# Patient Record
Sex: Female | Born: 1970
Health system: Southern US, Community
[De-identification: ages and names within clinical notes are randomized; demographics above are authoritative.]

## PROBLEM LIST (undated history)

## (undated) DIAGNOSIS — I639 Cerebral infarction, unspecified: Secondary | ICD-10-CM

## (undated) DIAGNOSIS — B019 Varicella without complication: Secondary | ICD-10-CM

## (undated) DIAGNOSIS — N920 Excessive and frequent menstruation with regular cycle: Secondary | ICD-10-CM

## (undated) DIAGNOSIS — I10 Essential (primary) hypertension: Secondary | ICD-10-CM

## (undated) HISTORY — PX: ABDOMINAL HYSTERECTOMY: SHX81

## (undated) HISTORY — DX: Cerebral infarction, unspecified: I63.9

## (undated) HISTORY — DX: Excessive and frequent menstruation with regular cycle: N92.0

## (undated) HISTORY — PX: OTHER SURGICAL HISTORY: SHX169

## (undated) HISTORY — DX: Essential (primary) hypertension: I10

## (undated) HISTORY — PX: TUBAL LIGATION: SHX77

## (undated) HISTORY — DX: Varicella without complication: B01.9

---

## 2014-04-11 ENCOUNTER — Ambulatory Visit: Payer: Self-pay | Admitting: Physician Assistant

## 2014-04-11 LAB — URINALYSIS, COMPLETE
BILIRUBIN, UR: NEGATIVE
Blood: NEGATIVE
GLUCOSE, UR: NEGATIVE
Ketone: NEGATIVE
Nitrite: POSITIVE
Ph: 6 (ref 5.0–8.0)
SPECIFIC GRAVITY: 1.02 (ref 1.000–1.030)

## 2014-04-13 LAB — URINE CULTURE

## 2017-05-21 ENCOUNTER — Ambulatory Visit
Admission: EM | Admit: 2017-05-21 | Discharge: 2017-05-21 | Disposition: A | Discharge status: Home / Self Care | Payer: BLUE CROSS/BLUE SHIELD | Attending: Family Medicine | Admitting: Family Medicine

## 2017-05-21 ENCOUNTER — Encounter: Payer: Self-pay | Admitting: Emergency Medicine

## 2017-05-21 DIAGNOSIS — R310 Gross hematuria: Secondary | ICD-10-CM | POA: Diagnosis not present

## 2017-05-21 LAB — URINALYSIS, COMPLETE (UACMP) WITH MICROSCOPIC

## 2017-05-21 MED ORDER — NITROFURANTOIN MONOHYD MACRO 100 MG PO CAPS: 100.0000 mg | ORAL_CAPSULE | Freq: Two times a day (BID) | ORAL | 0 refills | Status: DC

## 2017-05-21 NOTE — ED Triage Notes (Signed)
Patient reports blood in her urine since Monday.  Patient denies back or abdominal pain.

## 2017-05-21 NOTE — ED Provider Notes (Addendum)
[facility]    CSN: [id number] Arrival date & time: 05/21/17  0848     History   Chief Complaint Chief Complaint  Patient presents with  . Hematuria    HPI Cindy Martin is a 46 y.o. female.   The history is provided by the patient.  Hematuria  This is a new problem. The current episode started more than 2 days ago. The problem occurs constantly. The problem has not changed since onset.Pertinent negatives include no chest pain, no abdominal pain, no headaches and no shortness of breath. Associated symptoms comments: Denies any fevers, chills, abdominal pain. She has tried nothing for the symptoms.    History reviewed. No pertinent past medical history.  There are no active problems to display for this patient.   Past Surgical History:  Procedure Laterality Date  . TUBAL LIGATION      OB History    No data available       Home Medications    Prior to Admission medications   Medication Sig Start Date End Date Taking? Authorizing Provider  phentermine 37.5 MG capsule Take 37.5 mg by mouth every morning.   Yes [provider]  nitrofurantoin, macrocrystal-monohydrate, (MACROBID) 100 MG capsule Take 1 capsule (100 mg total) by mouth 2 (two) times daily. 05/21/17   Norval Gable, MD    Family History History reviewed. No pertinent family history.  Social History Social History  Substance Use Topics  . Smoking status: Never Smoker  . Smokeless tobacco: Never Used  . Alcohol use No     Allergies   Patient has no known allergies.   Review of Systems Review of Systems  Respiratory: Negative for shortness of breath.   Cardiovascular: Negative for chest pain.  Gastrointestinal: Negative for abdominal pain.  Genitourinary: Positive for hematuria.  Neurological: Negative for headaches.     Physical Exam Triage Vital Signs ED Triage Vitals  Enc Vitals Group     BP 05/21/17 0908 124/84     Pulse Rate 05/21/17 0908 75     Resp  05/21/17 0908 14     Temp 05/21/17 0908 98.2 F (36.8 C)     Temp Source 05/21/17 0908 Oral     SpO2 05/21/17 0908 98 %     Weight 05/21/17 0904 183 lb (83 kg)     Height 05/21/17 0904 5\' 8"  (1.727 m)     Head Circumference --      Peak Flow --      Pain Score 05/21/17 0904 0     Pain Loc --      Pain Edu? --      Excl. in [place]? --    No data found.   Updated Vital Signs BP 124/84 (BP Location: Right Arm)   Pulse 75   Temp 98.2 F (36.8 C) (Oral)   Resp 14   Ht 5\' 8"  (1.727 m)   Wt 183 lb (83 kg)   LMP 05/03/2017 (Exact Date)   SpO2 98%   BMI 27.83 kg/m   Visual Acuity Right Eye Distance:   Left Eye Distance:   Bilateral Distance:    Right Eye Near:   Left Eye Near:    Bilateral Near:     Physical Exam  Constitutional: She appears well-developed and well-nourished. No distress.  Abdominal: Soft. Bowel sounds are normal. She exhibits no distension and no mass. There is no tenderness. There is no rebound and no guarding.  Skin: She is not diaphoretic.  Nursing  note and vitals reviewed.    UC Treatments / Results  Labs (all labs ordered are listed, but only abnormal results are displayed) Labs Reviewed  URINALYSIS, COMPLETE (UACMP) WITH MICROSCOPIC - Abnormal; Notable for the following:       Result Value   Color, Urine RED (*)    APPearance CLOUDY (*)    Glucose, UA   (*)    Value: TEST NOT REPORTED DUE TO COLOR INTERFERENCE OF URINE PIGMENT   Hgb urine dipstick   (*)    Value: TEST NOT REPORTED DUE TO COLOR INTERFERENCE OF URINE PIGMENT   Bilirubin Urine   (*)    Value: TEST NOT REPORTED DUE TO COLOR INTERFERENCE OF URINE PIGMENT   Ketones, ur   (*)    Value: TEST NOT REPORTED DUE TO COLOR INTERFERENCE OF URINE PIGMENT   Protein, ur   (*)    Value: TEST NOT REPORTED DUE TO COLOR INTERFERENCE OF URINE PIGMENT   Nitrite   (*)    Value: TEST NOT REPORTED DUE TO COLOR INTERFERENCE OF URINE PIGMENT   Leukocytes, UA   (*)    Value: TEST NOT REPORTED DUE TO  COLOR INTERFERENCE OF URINE PIGMENT   Squamous Epithelial / LPF 0-5 (*)    Bacteria, UA FEW (*)    All other components within normal limits  URINE CULTURE    EKG  EKG Interpretation None       Radiology No results found.  Procedures Procedures (including critical care time)  Medications Ordered in UC Medications - No data to display   Initial Impression / Assessment and Plan / UC Course  I have reviewed the triage vital signs and the nursing notes.  Pertinent labs & imaging results that were available during my care of the patient were reviewed by me and considered in my medical decision making (see chart for details).       Final Clinical Impressions(s) / UC Diagnoses   Final diagnoses:  Gross hematuria    New Prescriptions Discharge Medication List as of 05/21/2017  9:40 AM    START taking these medications   Details  nitrofurantoin, macrocrystal-monohydrate, (MACROBID) 100 MG capsule Take 1 capsule (100 mg total) by mouth 2 (two) times daily., Starting Fri 05/21/2017, Normal       1. Lab results and diagnosis reviewed with patient 2. rx as per orders above; reviewed possible side effects, interactions, risks and benefits  3. Check urine culture  4. Recommend supportive treatment with increased water 5. Discussed with patient recommend follow with PCP and/or urologist in next 10-14 days to recheck resolution of hematuria 6. 6. Follow-up prn if symptoms worsen or don't improve  Controlled Substance Prescriptions [place] Controlled Substance Registry consulted? Not Applicable   Norval Gable, MD 05/21/17 [place], [place], MD 05/21/17 [id number]

## 2017-05-21 NOTE — Discharge Instructions (Signed)
Follow up with Primary Care Provider and/or Urologist for further evaluation if no improvement

## 2017-05-22 LAB — URINE CULTURE
Culture: NO GROWTH
Special Requests: NORMAL

## 2017-08-26 ENCOUNTER — Ambulatory Visit
Admission: EM | Admit: 2017-08-26 | Discharge: 2017-08-26 | Disposition: A | Discharge status: Home / Self Care | Payer: BLUE CROSS/BLUE SHIELD | Attending: Emergency Medicine | Admitting: Emergency Medicine

## 2017-08-26 ENCOUNTER — Other Ambulatory Visit: Payer: Self-pay

## 2017-08-26 DIAGNOSIS — M791 Myalgia, unspecified site: Secondary | ICD-10-CM | POA: Diagnosis not present

## 2017-08-26 DIAGNOSIS — R05 Cough: Secondary | ICD-10-CM

## 2017-08-26 DIAGNOSIS — R0981 Nasal congestion: Secondary | ICD-10-CM

## 2017-08-26 DIAGNOSIS — R509 Fever, unspecified: Secondary | ICD-10-CM | POA: Diagnosis not present

## 2017-08-26 DIAGNOSIS — R6889 Other general symptoms and signs: Secondary | ICD-10-CM | POA: Diagnosis present

## 2017-08-26 LAB — RAPID INFLUENZA A&B ANTIGENS
Influenza A (ARMC): NEGATIVE
Influenza B (ARMC): NEGATIVE

## 2017-08-26 NOTE — Discharge Instructions (Addendum)
Your flu test was negative. Rest,push fluids, take OTC meds for symptom management.

## 2017-08-26 NOTE — ED Provider Notes (Signed)
[facility]    CSN: [id number] Arrival date & time: 08/26/17  1430     History   Chief Complaint Chief Complaint  Patient presents with  . Generalized Body Aches    HPI Cindy Martin is a 47 y.o. female.   47 yr old female pt presents to UC with cc of URI symptoms, works at [employer] reports + flu   The history is provided by the patient. No language interpreter was used.  URI  Presenting symptoms: congestion, cough and fever   Severity:  Moderate Onset quality:  Sudden Timing:  Constant Progression:  Unchanged Chronicity:  New Relieved by:  Nothing Worsened by:  Nothing Ineffective treatments:  OTC medications Associated symptoms: myalgias and sneezing   Risk factors: sick contacts   Risk factors comment:  + flu at work   History reviewed. No pertinent past medical history.  Patient Active Problem List   Diagnosis Date Noted  . Flu-like symptoms 08/26/2017    Past Surgical History:  Procedure Laterality Date  . TUBAL LIGATION      OB History    No data available       Home Medications    Prior to Admission medications   Medication Sig Start Date End Date Taking? Authorizing Provider  phentermine 37.5 MG capsule Take 37.5 mg by mouth every morning.   Yes [provider]  nitrofurantoin, macrocrystal-monohydrate, (MACROBID) 100 MG capsule Take 1 capsule (100 mg total) by mouth 2 (two) times daily. 05/21/17   Norval Gable, MD    Family History History reviewed. No pertinent family history.  Social History Social History   Tobacco Use  . Smoking status: Never Smoker  . Smokeless tobacco: Never Used  Substance Use Topics  . Alcohol use: No  . Drug use: No     Allergies   Patient has no known allergies.   Review of Systems Review of Systems  Constitutional: Positive for fever.  HENT: Positive for congestion, sinus pressure and sneezing.   Eyes: Negative.   Respiratory: Positive for cough.     Cardiovascular: Negative.   Gastrointestinal: Negative.   Endocrine: Negative.   Musculoskeletal: Positive for myalgias.  Skin: Negative for rash.  Neurological: Negative.   Hematological: Negative.   Psychiatric/Behavioral: Negative.   All other systems reviewed and are negative.    Physical Exam Triage Vital Signs ED Triage Vitals  Enc Vitals Group     BP 08/26/17 1517 112/82     Pulse Rate 08/26/17 1517 92     Resp 08/26/17 1517 18     Temp 08/26/17 1517 98.4 F (36.9 C)     Temp Source 08/26/17 1517 Oral     SpO2 08/26/17 1517 100 %     Weight 08/26/17 1516 183 lb (83 kg)     Height 08/26/17 1516 5\' 9"  (1.753 m)     Head Circumference --      Peak Flow --      Pain Score 08/26/17 1516 9     Pain Loc --      Pain Edu? --      Excl. in [place]? --    No data found.  Updated Vital Signs BP 112/82 (BP Location: Left Arm)   Pulse 92   Temp 98.4 F (36.9 C) (Oral) Comment: ibuprofen 2 hours ago  Resp 18   Ht 5\' 9"  (1.753 m)   Wt 183 lb (83 kg)   LMP 08/15/2017   SpO2 100%   BMI  27.02 kg/m   Visual Acuity  Physical Exam  Constitutional: She is oriented to person, place, and time. She appears well-developed and well-nourished. She is active and cooperative. No distress.  HENT:  Head: Normocephalic.  Right Ear: Tympanic membrane is retracted.  Left Ear: Tympanic membrane is retracted.  Nose: Mucosal edema present.  Mouth/Throat: Uvula is midline and oropharynx is clear and moist.  Eyes: Conjunctivae, EOM and lids are normal. Pupils are equal, round, and reactive to light.  Neck: Normal range of motion. No tracheal deviation present.  Cardiovascular: Regular rhythm, normal heart sounds and normal pulses.  No murmur heard. Pulmonary/Chest: Effort normal and breath sounds normal.  Abdominal: Soft. Bowel sounds are normal. There is no tenderness.  Musculoskeletal: Normal range of motion.  Lymphadenopathy:    She has no cervical adenopathy.  Neurological: She is  alert and oriented to person, place, and time. GCS eye subscore is 4. GCS verbal subscore is 5. GCS motor subscore is 6.  Skin: Skin is warm and dry. No rash noted.  Psychiatric: She has a normal mood and affect. Her speech is normal and behavior is normal.  Nursing note and vitals reviewed.    UC Treatments / Results  Labs (all labs ordered are listed, but only abnormal results are displayed) Labs Reviewed  RAPID INFLUENZA A&B ANTIGENS ([place])    EKG  EKG Interpretation None       Radiology No results found.  Procedures Procedures (including critical care time)  Medications Ordered in UC Medications - No data to display   Initial Impression / Assessment and Plan / UC Course  I have reviewed the triage vital signs and the nursing notes.  Pertinent labs & imaging results that were available during my care of the patient were reviewed by me and considered in my medical decision making (see chart for details).   [id number], bronchitis  Your flu test was negative. Offered tamiflu pt declined. Rest,push fluids, take OTC meds for symptom management. Work note given. Follow up with PCP.  Final Clinical Impressions(s) / UC Diagnoses   Final diagnoses:  Flu-like symptoms    ED Discharge Orders    None       Controlled Substance Prescriptions    Tori Milks, NP 56/21/30 2008

## 2017-08-26 NOTE — ED Triage Notes (Signed)
Patient complains of body aches, fever, chills, cough that started suddenly yesterday. 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

## 2019-03-08 ENCOUNTER — Other Ambulatory Visit (INDEPENDENT_AMBULATORY_CARE_PROVIDER_SITE_OTHER): Payer: Self-pay | Admitting: Vascular Surgery

## 2019-09-04 ENCOUNTER — Ambulatory Visit: Payer: BC Managed Care – PPO | Attending: Internal Medicine

## 2019-09-04 DIAGNOSIS — Z20822 Contact with and (suspected) exposure to covid-19: Secondary | ICD-10-CM

## 2019-09-05 LAB — NOVEL CORONAVIRUS, NAA: SARS-CoV-2, NAA: NOT DETECTED

## 2019-12-11 NOTE — Patient Instructions (Signed)
I value your feedback and entrusting us with your care. If you get a [facility] patient survey, I would appreciate you taking the time to let us know about your experience today. Thank you!  As of June 29, 2019, your lab results will be released to your MyChart immediately, before I even have a chance to see them. Please give me time to review them and contact you if there are any abnormalities. Thank you for your patience.  

## 2019-12-11 NOTE — Progress Notes (Signed)
PCP:  Patient, No Pcp Per   Chief Complaint  Patient presents with  . Gynecologic Exam  . Menorrhagia    clots are huge, very heavy     HPI:      Ms. Cindy Martin is a 49 y.o. No obstetric history on file. whose LMP was Patient's last menstrual period was 12/09/2019 (exact date)., presents today for her NP annual examination.  Her menses are regular every 28-30 days, lasting 4 days, very heavy flow for 1 day with large clots (> 1/2 dollar size).  Dysmenorrhea none. She does not have intermenstrual bleeding. Interested in endometrial ablation for flow.  Sex activity: single partner, contraception - none. S/p TL with reversal in past. No current BC. Did OCPs as a teenager. No hx of HTN, DVTs, migraines with aura. Last Pap: not recent; no hx of abn  Last mammogram: never There is no FH of breast cancer. There is no FH of ovarian cancer. The patient does do self-breast exams.  Tobacco use: The patient denies current or previous tobacco use. Alcohol use: none No drug use.  Exercise: not active  She does not get adequate calcium and Vitamin D in her diet. No recent fasting labs.   Past Medical History:  Diagnosis Date  . Menorrhagia     Past Surgical History:  Procedure Laterality Date  . TUBAL LIGATION      Family History  Problem Relation Age of Onset  . Breast cancer Neg Hx   . Ovarian cancer Neg Hx     Social History   Socioeconomic History  . Marital status: Married    Spouse name: Not on file  . Number of children: Not on file  . Years of education: Not on file  . Highest education level: Not on file  Occupational History  . Not on file  Tobacco Use  . Smoking status: Never Smoker  . Smokeless tobacco: Never Used  Substance and Sexual Activity  . Alcohol use: No  . Drug use: No  . Sexual activity: Yes    Birth control/protection: Surgical    Comment: Tubal ligation/reversed  Other Topics Concern  . Not on file  Social History Narrative  . Not  on file   Social Determinants of Health   Financial Resource Strain:   . Difficulty of Paying Living Expenses:   Food Insecurity:   . Worried About Charity fundraiser in the Last Year:   . Arboriculturist in the Last Year:   Transportation Needs:   . Film/video editor (Medical):   Marland Kitchen Lack of Transportation (Non-Medical):   Physical Activity:   . Days of Exercise per Week:   . Minutes of Exercise per Session:   Stress:   . Feeling of Stress :   Social Connections:   . Frequency of Communication with Friends and Family:   . Frequency of Social Gatherings with Friends and Family:   . Attends Religious Services:   . Active Member of Clubs or Organizations:   . Attends Archivist Meetings:   Marland Kitchen Marital Status:   Intimate Partner Violence:   . Fear of Current or Ex-Partner:   . Emotionally Abused:   Marland Kitchen Physically Abused:   . Sexually Abused:     No current outpatient medications on file.     ROS:  Review of Systems  Constitutional: Negative for fatigue, fever and unexpected weight change.  Respiratory: Negative for cough, shortness of breath and wheezing.  Cardiovascular: Negative for chest pain, palpitations and leg swelling.  Gastrointestinal: Negative for blood in stool, constipation, diarrhea, nausea and vomiting.  Endocrine: Negative for cold intolerance, heat intolerance and polyuria.  Genitourinary: Positive for menstrual problem. Negative for dyspareunia, dysuria, flank pain, frequency, genital sores, hematuria, pelvic pain, urgency, vaginal bleeding, vaginal discharge and vaginal pain.  Musculoskeletal: Negative for back pain, joint swelling and myalgias.  Skin: Negative for rash.  Neurological: Negative for dizziness, syncope, light-headedness, numbness and headaches.  Hematological: Negative for adenopathy.  Psychiatric/Behavioral: Negative for agitation, confusion, sleep disturbance and suicidal ideas. The patient is not nervous/anxious.     BREAST: No symptoms   Objective: BP 118/80   Ht 5\' 9"  (1.753 m)   Wt 239 lb (108.4 kg)   LMP 12/09/2019 (Exact Date)   BMI 35.29 kg/m    Physical Exam Constitutional:      Appearance: She is well-developed.  Genitourinary:     Vulva, vagina, cervix, uterus, right adnexa and left adnexa normal.     No vulval lesion or tenderness noted.     No vaginal discharge, erythema or tenderness.     No cervical polyp.     Uterus is not enlarged or tender.     No right or left adnexal mass present.     Right adnexa not tender.     Left adnexa not tender.  Neck:     Thyroid: No thyromegaly.  Cardiovascular:     Rate and Rhythm: Normal rate and regular rhythm.     Heart sounds: Normal heart sounds. No murmur.  Pulmonary:     Effort: Pulmonary effort is normal.     Breath sounds: Normal breath sounds.  Chest:     Breasts:        Right: No mass, nipple discharge, skin change or tenderness.        Left: No mass, nipple discharge, skin change or tenderness.  Abdominal:     Palpations: Abdomen is soft.     Tenderness: There is no abdominal tenderness. There is no guarding.  Musculoskeletal:        General: Normal range of motion.     Cervical back: Normal range of motion.  Neurological:     General: No focal deficit present.     Mental Status: She is alert and oriented to person, place, and time.     Cranial Nerves: No cranial nerve deficit.  Skin:    General: Skin is warm and dry.  Psychiatric:        Mood and Affect: Mood normal.        Behavior: Behavior normal.        Thought Content: Thought content normal.        Judgment: Judgment normal.  Vitals reviewed.     Assessment/Plan: Encounter for annual routine gynecological examination  Cervical cancer screening - Plan: Cytology - PAP  Screening for HPV (human papillomavirus) - Plan: Cytology - PAP  Encounter for screening mammogram for malignant neoplasm of breast - Plan: MM 3D SCREEN BREAST BILATERAL; pt to sched  mammo  Menorrhagia with regular cycle - Plan: CBC; Check labs and GYN u/s. Discussed hormones, IUD, ablation, Lysteda. Pt interested in endometrial ablation. F/u with MD after GYN u/s for consult. Pt had TL reversal.   Blood tests for routine general physical examination - Plan: Comprehensive metabolic panel, CBC, Hemoglobin A1c, Lipid panel  Screening cholesterol level - Plan: Lipid panel  Screening for diabetes mellitus - Plan: Hemoglobin A1c  GYN counsel breast self exam, mammography screening, adequate intake of calcium and vitamin D, diet and exercise     F/U  Return in about 1 week (around 12/19/2019) for GYN u/s for menorrhagia with MD appt after to discuss endometrial ablation.  Ethelean Colla B. Peaches Vanoverbeke, PA-C 12/12/2019 9:17 AM

## 2019-12-12 ENCOUNTER — Encounter: Payer: Self-pay | Admitting: Obstetrics and Gynecology

## 2019-12-12 ENCOUNTER — Other Ambulatory Visit (HOSPITAL_COMMUNITY)
Admission: RE | Admit: 2019-12-12 | Discharge: 2019-12-12 | Disposition: A | Discharge status: Home / Self Care | Payer: BC Managed Care – PPO | Source: Ambulatory Visit | Attending: Obstetrics and Gynecology | Admitting: Obstetrics and Gynecology

## 2019-12-12 ENCOUNTER — Other Ambulatory Visit: Payer: Self-pay

## 2019-12-12 ENCOUNTER — Ambulatory Visit (INDEPENDENT_AMBULATORY_CARE_PROVIDER_SITE_OTHER): Payer: BC Managed Care – PPO | Admitting: Obstetrics and Gynecology

## 2019-12-12 VITALS — BP 118/80 | Ht 69.0 in | Wt 239.0 lb

## 2019-12-12 DIAGNOSIS — Z01419 Encounter for gynecological examination (general) (routine) without abnormal findings: Secondary | ICD-10-CM | POA: Diagnosis not present

## 2019-12-12 DIAGNOSIS — Z13220 Encounter for screening for lipoid disorders: Secondary | ICD-10-CM | POA: Diagnosis not present

## 2019-12-12 DIAGNOSIS — Z131 Encounter for screening for diabetes mellitus: Secondary | ICD-10-CM | POA: Diagnosis not present

## 2019-12-12 DIAGNOSIS — Z1231 Encounter for screening mammogram for malignant neoplasm of breast: Secondary | ICD-10-CM | POA: Diagnosis not present

## 2019-12-12 DIAGNOSIS — Z1151 Encounter for screening for human papillomavirus (HPV): Secondary | ICD-10-CM

## 2019-12-12 DIAGNOSIS — N920 Excessive and frequent menstruation with regular cycle: Secondary | ICD-10-CM | POA: Diagnosis not present

## 2019-12-12 DIAGNOSIS — Z124 Encounter for screening for malignant neoplasm of cervix: Secondary | ICD-10-CM | POA: Insufficient documentation

## 2019-12-12 DIAGNOSIS — Z0000 Encounter for general adult medical examination without abnormal findings: Secondary | ICD-10-CM

## 2019-12-13 LAB — LIPID PANEL
Chol/HDL Ratio: 3.5 ratio (ref 0.0–4.4)
Cholesterol, Total: 172 mg/dL (ref 100–199)
HDL: 49 mg/dL (ref >39)
LDL Chol Calc (NIH): 105 mg/dL — ABNORMAL HIGH (ref 0–99)
Triglycerides: 101 mg/dL (ref 0–149)
VLDL Cholesterol Cal: 18 mg/dL (ref 5–40)

## 2019-12-13 LAB — CYTOLOGY - PAP
Comment: NEGATIVE
Diagnosis: NEGATIVE
High risk HPV: NEGATIVE

## 2019-12-13 LAB — CBC
Hematocrit: 41.4 % (ref 34.0–46.6)
Hemoglobin: 12.9 g/dL (ref 11.1–15.9)
MCH: 27.6 pg (ref 26.6–33.0)
MCHC: 31.2 g/dL — ABNORMAL LOW (ref 31.5–35.7)
MCV: 89 fL (ref 79–97)
Platelets: 245 10*3/uL (ref 150–450)
RBC: 4.68 x10E6/uL (ref 3.77–5.28)
RDW: 14.5 % (ref 11.7–15.4)
WBC: 5.2 10*3/uL (ref 3.4–10.8)

## 2019-12-13 LAB — COMPREHENSIVE METABOLIC PANEL
ALT: 20 IU/L (ref 0–32)
AST: 19 IU/L (ref 0–40)
Albumin/Globulin Ratio: 1.8 (ref 1.2–2.2)
Albumin: 4.2 g/dL (ref 3.8–4.8)
Alkaline Phosphatase: 94 IU/L (ref 48–121)
BUN/Creatinine Ratio: 24 — ABNORMAL HIGH (ref 9–23)
BUN: 24 mg/dL (ref 6–24)
Bilirubin Total: <0.2 mg/dL (ref 0.0–1.2)
CO2: 20 mmol/L (ref 20–29)
Calcium: 9.3 mg/dL (ref 8.7–10.2)
Chloride: 108 mmol/L — ABNORMAL HIGH (ref 96–106)
Creatinine, Ser: 0.98 mg/dL (ref 0.57–1.00)
GFR calc Af Amer: 80 mL/min/{1.73_m2} (ref >59)
GFR calc non Af Amer: 69 mL/min/{1.73_m2} (ref >59)
Globulin, Total: 2.4 g/dL (ref 1.5–4.5)
Glucose: 100 mg/dL — ABNORMAL HIGH (ref 65–99)
Potassium: 4.4 mmol/L (ref 3.5–5.2)
Sodium: 142 mmol/L (ref 134–144)
Total Protein: 6.6 g/dL (ref 6.0–8.5)

## 2019-12-13 LAB — HEMOGLOBIN A1C
Est. average glucose Bld gHb Est-mCnc: 114 mg/dL
Hgb A1c MFr Bld: 5.6 % (ref 4.8–5.6)

## 2019-12-14 ENCOUNTER — Other Ambulatory Visit: Payer: Self-pay | Admitting: Obstetrics & Gynecology

## 2019-12-14 DIAGNOSIS — N920 Excessive and frequent menstruation with regular cycle: Secondary | ICD-10-CM

## 2019-12-21 ENCOUNTER — Ambulatory Visit (INDEPENDENT_AMBULATORY_CARE_PROVIDER_SITE_OTHER): Payer: BC Managed Care – PPO | Admitting: Obstetrics & Gynecology

## 2019-12-21 ENCOUNTER — Other Ambulatory Visit: Payer: Self-pay

## 2019-12-21 ENCOUNTER — Encounter: Payer: Self-pay | Admitting: Obstetrics & Gynecology

## 2019-12-21 ENCOUNTER — Ambulatory Visit (INDEPENDENT_AMBULATORY_CARE_PROVIDER_SITE_OTHER): Payer: BC Managed Care – PPO

## 2019-12-21 VITALS — BP 130/80 | Ht 69.0 in | Wt 238.0 lb

## 2019-12-21 DIAGNOSIS — N920 Excessive and frequent menstruation with regular cycle: Secondary | ICD-10-CM

## 2019-12-21 DIAGNOSIS — D219 Benign neoplasm of connective and other soft tissue, unspecified: Secondary | ICD-10-CM | POA: Insufficient documentation

## 2019-12-21 NOTE — Patient Instructions (Signed)
Total Laparoscopic Hysterectomy A total laparoscopic hysterectomy is a minimally invasive surgery to remove the uterus and cervix. The fallopian tubes and ovaries can also be removed (bilateral salpingo-oophorectomy) during this surgery, if necessary. This procedure may be done to treat problems such as:  Noncancerous growths in the uterus (uterine fibroids) that cause symptoms.  A condition that causes the lining of the uterus (endometrium) to grow in other areas (endometriosis).  Problems with pelvic support. This is caused by weakened muscles of the pelvis following vaginal childbirth or menopause.  Cancer of the cervix, ovaries, uterus, or endometrium.  Excessive (dysfunctional) uterine bleeding. This surgery is performed by inserting a thin, lighted tube (laparoscope) and surgical instruments into small incisions in the abdomen. The laparoscope sends images to a monitor. The images help the health care provider perform the procedure. After this procedure, you will no longer be able to have a baby, and you will no longer have a menstrual period. Tell a health care provider about:  Any allergies you have.  All medicines you are taking, including vitamins, herbs, eye drops, creams, and over-the-counter medicines.  Any problems you or family members have had with anesthetic medicines.  Any blood disorders you have.  Any surgeries you have had.  Any medical conditions you have.  Whether you are pregnant or may be pregnant. What are the risks? Generally, this is a safe procedure. However, problems may occur, including:  Infection.  Bleeding.  Blood clots in the legs or lungs.  Allergic reactions to medicines.  Damage to other structures or organs.  The risk that the surgery may have to be switched to the regular one in which a large incision is made in the abdomen (abdominal hysterectomy). What happens before the procedure? Staying hydrated Follow instructions from your  health care provider about hydration, which may include:  Up to 2 hours before the procedure - you may continue to drink clear liquids, such as water, clear fruit juice, black coffee, and plain tea Eating and drinking restrictions Follow instructions from your health care provider about eating and drinking, which may include:  8 hours before the procedure - stop eating heavy meals or foods such as meat, fried foods, or fatty foods.  6 hours before the procedure - stop eating light meals or foods, such as toast or cereal.  6 hours before the procedure - stop drinking milk or drinks that contain milk.  2 hours before the procedure - stop drinking clear liquids. Medicines  Ask your health care provider about: ? Changing or stopping your regular medicines. This is especially important if you are taking diabetes medicines or blood thinners. ? Taking over-the-counter medicines, vitamins, herbs, and supplements. ? Taking medicines such as aspirin and ibuprofen. These medicines can thin your blood. Do not take these medicines unless your health care provider tells you to take them.  You may be given antibiotic medicine to help prevent infection.  You may be asked to take laxatives.  You may be given medicines to help prevent nausea and vomiting after the procedure. General instructions  Ask your health care provider how your surgical site will be marked or identified.  You may be asked to shower with a germ-killing soap.  Do not use any products that contain nicotine or tobacco, such as cigarettes and e-cigarettes. If you need help quitting, ask your health care provider.  You may have an exam or testing, such as an ultrasound to determine the size and shape of your pelvic organs.    You may have a blood or urine sample taken.  This procedure can affect the way you feel about yourself. Talk with your health care provider about the physical and emotional changes hysterectomy may  cause.  Plan to have someone take you home from the hospital or clinic.  Plan to have a responsible adult care for you for at least 24 hours after you leave the hospital or clinic. This is important. What happens during the procedure?  To lower your risk of infection: ? Your health care team will wash or sanitize their hands. ? Your skin will be washed with soap. ? Hair may be removed from the surgical area.  An IV will be inserted into one of your veins.  You will be given one or more of the following: ? A medicine to help you relax (sedative). ? A medicine to make you fall asleep (general anesthetic).  You will be given antibiotic medicine through your IV.  A tube may be inserted down your throat to help you breathe during the procedure.  A gas (carbon dioxide) will be used to inflate your abdomen to allow your surgeon to see inside of your abdomen.  Three or four small incisions will be made in your abdomen.  A laparoscope will be inserted into one of your incisions. Surgical instruments will be inserted through the other incisions in order to perform the procedure.  Your uterus and cervix may be removed through your vagina or cut into small pieces and removed through the small incisions. Any other organs that need to be removed will also be removed this way.  Carbon dioxide will be released from inside of your abdomen.  Your incisions will be closed with stitches (sutures).  A bandage (dressing) may be placed over your incisions. The procedure may vary among health care providers and hospitals. What happens after the procedure?  Your blood pressure, heart rate, breathing rate, and blood oxygen level will be monitored until the medicines you were given have worn off.  You will be given medicine for pain and nausea as needed.  Do not drive for 24 hours if you received a sedative. Summary  Total Laparoscopic hysterectomy is a procedure to remove your uterus, cervix and  sometimes the fallopian tubes and ovaries.  This procedure can affect the way you feel about yourself. Talk with your health care provider about the physical and emotional changes hysterectomy may cause.  After this procedure, you will no longer be able to have a baby, and you will no longer have a menstrual period.  You will be given pain medicine to control discomfort after this procedure. This information is not intended to replace advice given to you by your health care provider. Make sure you discuss any questions you have with your health care provider. Document Revised: 06/18/2017 Document Reviewed: 09/16/2016 Elsevier Patient Education  2020 Elsevier Inc.  

## 2019-12-21 NOTE — Progress Notes (Signed)
HPI: Uterine Fibroids Patient presents with uterine fibroids. Periods are regular every 28-30 days, lasting 4 days. 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  Dysmenorrhea:none. Cyclic symptoms include none. No intermenstrual bleeding, spotting, or discharge.  Ultrasound demonstrates 4 fibroids, see below  PMHx: She  has a past medical history of Menorrhagia. Also,  has a past surgical history that includes Tubal ligation., family history is not on file.,  reports that she has never smoked. She has never used smokeless tobacco. She reports that she does not drink alcohol or use drugs.  She currently has no medications in their medication list. Also, has No Known Allergies.  Review of Systems  Constitutional: Positive for malaise/fatigue. Negative for chills and fever.  HENT: Negative for congestion, sinus pain and sore throat.   Eyes: Negative for blurred vision and pain.  Respiratory: Negative for cough and wheezing.   Cardiovascular: Negative for chest pain and leg swelling.  Gastrointestinal: Negative for abdominal pain, constipation, diarrhea, heartburn, nausea and vomiting.  Genitourinary: Negative for dysuria, frequency, hematuria and urgency.  Musculoskeletal: Negative for back pain, joint pain, myalgias and neck pain.  Skin: Negative for itching and rash.  Neurological: Negative for dizziness, tremors and weakness.  Endo/Heme/Allergies: Does not bruise/bleed easily.  Psychiatric/Behavioral: Negative for depression. The patient is not nervous/anxious and does not have insomnia.   All other systems reviewed and are negative.   Objective: BP 130/80   Ht 5\' 9"  (1.753 m)   Wt 238 lb (108 kg)   LMP 12/09/2019 (Exact Date)   BMI 35.15 kg/m   Physical examination Constitutional NAD, Conversant  Skin No rashes, lesions or ulceration.   Extremities: Moves all appropriately.  Normal ROM for age. No lymphadenopathy.    Neuro: Grossly intact  Psych: Oriented to PPT.  Normal mood. Normal affect.   US PELVIS TRANSVAGINAL NON-OB (TV ONLY)  Result Date: 12/21/2019 Patient Name: Cindy Martin DOB: [birth year] MRN: [id number] ULTRASOUND REPORT Location: [facility] Date of Service: 12/21/2019 Indications: Large clots with cycles Findings: The uterus is anteverted and measures 11.4 x 7.1 x 6.2cm. Echo texture is homogenous with evidence of focal masses. Within the uterus are multiple suspected fibroids measuring: Fibroid 1:  1.6 x 1.2 x 1.9cm ( RT/mid, SS) Fibroid 2:  1.0 x 1.1 x 1.0cm (RT/mid, SM) Fibroid 3:  3.2 x 3.2 x 2.6cm (LT/mid, IM/SM) Fibroid 4:  1.6 x 1.8 x 1.4cm (LT/inf, SS) The Endometrium is heterogeneous and measures 15.8 mm. Multiple hyperechoic foci throughout. No obvious flow/stocks visualized to suggest polyps.  Right Ovary measures 2.7 x 2.3 x 2.1cm. It is normal in appearance. Left Ovary measures 3.1 x 2.2 x 2.0cm. It is normal in appearance. Survey of the adnexa demonstrates no adnexal masses. There is no free fluid in the cul de sac. Impression: 1. Multi-fibroid uterus. 2. Heterogeneous endometrium with multiple hyperechoic foci throughout. Recommendations: 1.Clinical correlation with the patient's History and Physical Exam. Vita Barley, RT Review of ULTRASOUND.    I have personally reviewed images and report of recent ultrasound done at [facility].    Plan of management to be discussed with patient. Barnett Applebaum, MD, [facility], [facility] 12/21/2019  11:26 AM    Assessment:  Fibroid  Menorrhagia with regular cycle  Fibroid treatment such as [place], Lupron, Myomectomy, and Hysterectomy discussed in detail, with the pros and cons of each choice counseled.  No treatment as an option also  discussed, as well as control of symptoms alone with hormone therapy. Information provided to the patient.  Desires TLH BS (hysterectomy tx option)    Info given    Schedule as able  A total  of 25 minutes were spent face-to-face with the patient as well as preparation, review, communication, and documentation during this encounter.   Barnett Applebaum, MD, [facility], [facility] 12/21/2019  12:02 PM

## 2019-12-28 ENCOUNTER — Telehealth: Payer: Self-pay | Admitting: Obstetrics & Gynecology

## 2019-12-28 NOTE — Telephone Encounter (Signed)
Pt returned call to schedule TLH/BS with Kaylyn Layer 7/13  H&P 7/2 @ 8:20    Covid testing 7/9 @ 8-10:30, [facility] [employer], drive up and wear mask. Advised pt to quarantine until DOS.  Pre-admit phone call appointment to be requested - date and time will be included on H&P paper work. Also all appointments will be updated on pt MyChart. Explained that this appointment has a call window. Based on the time scheduled will indicate if the call will be received within a 4 hour window before 1:00 or after.  Advised that pt may also receive calls from the hospital pharmacy and pre-service center.  Confirmed pt has [employer] as Chartered certified accountant. No secondary insurance.

## 2019-12-28 NOTE — Telephone Encounter (Signed)
-----   Message from Gae Dry, MD sent at 12/21/2019 12:04 PM EDT ----- Regarding: SURGERY Surgery Booking Request Patient Full Name:  Cindy Martin  MRN: [id number]  DOB: May 14, 1971  Surgeon: Hoyt Koch, MD  Requested Surgery Date and Time: ANY Primary Diagnosis AND Code: 1. Fibroid  D21.9  2. Menorrhagia with regular cycle  N92.0  Secondary Diagnosis and Code:  Surgical Procedure: TLH/BS L&D Notification: No Admission Status: same day surgery Length of Surgery: 1 hr Special Case Needs: No H&P: Yes Phone Interview???:  Yes Interpreter: No Language:  Medical Clearance:  No Special Scheduling Instructions: no Any known health/anesthesia issues, diabetes, sleep apnea, latex allergy, defibrillator/pacemaker?: No Acuity: P3   (P1 highest, P2 delay may cause harm, P3 low, elective gyn, P4 lowest)

## 2020-01-19 ENCOUNTER — Other Ambulatory Visit: Payer: Self-pay

## 2020-01-19 ENCOUNTER — Encounter: Payer: Self-pay | Admitting: Obstetrics & Gynecology

## 2020-01-19 ENCOUNTER — Ambulatory Visit (INDEPENDENT_AMBULATORY_CARE_PROVIDER_SITE_OTHER): Payer: BC Managed Care – PPO | Admitting: Obstetrics & Gynecology

## 2020-01-19 VITALS — BP 130/80 | Ht 69.0 in | Wt 243.0 lb

## 2020-01-19 DIAGNOSIS — N920 Excessive and frequent menstruation with regular cycle: Secondary | ICD-10-CM

## 2020-01-19 DIAGNOSIS — D219 Benign neoplasm of connective and other soft tissue, unspecified: Secondary | ICD-10-CM

## 2020-01-19 NOTE — H&P (View-Only) (Signed)
PRE-OPERATIVE HISTORY AND PHYSICAL EXAM  HPI:  Cindy Martin is a 49 y.o. [phone] Patient's last menstrual period was 01/02/2020.; she is being admitted for surgery related to abnormal uterine bleeding and fibroids.  Periods are regular every 28-30 days, lasting 4 days. 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  Ultrasound reveals fibroids, at least 4, ranging from 2-4 cm in size.  PMHx: Past Medical History:  Diagnosis Date  . Menorrhagia    Past Surgical History:  Procedure Laterality Date  . TUBAL LIGATION     Family History  Problem Relation Age of Onset  . Breast cancer Neg Hx   . Ovarian cancer Neg Hx    Social History   Tobacco Use  . Smoking status: Never Smoker  . Smokeless tobacco: Never Used  Vaping Use  . Vaping Use: Never used  Substance Use Topics  . Alcohol use: No  . Drug use: No   No current outpatient medications on file. Allergies: Patient has no known allergies.  Review of Systems  Constitutional: Negative for chills, fever and malaise/fatigue.  HENT: Negative for congestion, sinus pain and sore throat.   Eyes: Negative for blurred vision and pain.  Respiratory: Negative for cough and wheezing.   Cardiovascular: Negative for chest pain and leg swelling.  Gastrointestinal: Negative for abdominal pain, constipation, diarrhea, heartburn, nausea and vomiting.  Genitourinary: Negative for dysuria, frequency, hematuria and urgency.  Musculoskeletal: Negative for back pain, joint pain, myalgias and neck pain.  Skin: Negative for itching and rash.  Neurological: Negative for dizziness, tremors and weakness.  Endo/Heme/Allergies: Does not bruise/bleed easily.  Psychiatric/Behavioral: Negative for depression. The patient is not nervous/anxious and does not have insomnia.     Objective: BP 130/80   Ht 5\' 9"  (1.753 m)   Wt 243 lb (110.2 kg)   LMP 01/02/2020   BMI 35.88 kg/m   Filed Weights     01/19/20 0817  Weight: 243 lb (110.2 kg)   Physical Exam Constitutional:      General: She is not in acute distress.    Appearance: She is well-developed.  HENT:     Head: Normocephalic and atraumatic. No laceration.     Right Ear: Hearing normal.     Left Ear: Hearing normal.     Mouth/Throat:     Pharynx: Uvula midline.  Eyes:     Pupils: Pupils are equal, round, and reactive to light.  Neck:     Thyroid: No thyromegaly.  Cardiovascular:     Rate and Rhythm: Normal rate and regular rhythm.     Heart sounds: No murmur heard.  No friction rub. No gallop.   Pulmonary:     Effort: Pulmonary effort is normal. No respiratory distress.     Breath sounds: Normal breath sounds. No wheezing.  Chest:     Breasts:        Right: No mass, skin change or tenderness.        Left: No mass, skin change or tenderness.  Abdominal:     General: Bowel sounds are normal. There is no distension.     Palpations: Abdomen is soft.     Tenderness: There is no abdominal tenderness. There is no rebound.  Musculoskeletal:        General: Normal range of motion.     Cervical back: Normal range of motion and neck supple.  Neurological:     Mental Status: She  is alert and oriented to person, place, and time.     Cranial Nerves: No cranial nerve deficit.  Skin:    General: Skin is warm and dry.  Psychiatric:        Judgment: Judgment normal.  Vitals reviewed.     Assessment: 1. Fibroid   2. Menorrhagia with regular cycle   Options discussed, plan TLH BS.  I have had a careful discussion with this patient about all the options available and the risk/benefits of each. I have fully informed this patient that surgery may subject her to a variety of discomforts and risks: She understands that most patients have surgery with little difficulty, but problems can happen ranging from minor to fatal. These include nausea, vomiting, pain, bleeding, infection, poor healing, hernia, or formation of adhesions.  Unexpected reactions may occur from any drug or anesthetic given. Unintended injury may occur to other pelvic or abdominal structures such as Fallopian tubes, ovaries, bladder, ureter (tube from kidney to bladder), or bowel. Nerves going from the pelvis to the legs may be injured. Any such injury may require immediate or later additional surgery to correct the problem. Excessive blood loss requiring transfusion is very unlikely but possible. Dangerous blood clots may form in the legs or lungs. Physical and sexual activity will be restricted in varying degrees for an indeterminate period of time but most often 2-6 weeks.  Finally, she understands that it is impossible to list every possible undesirable effect and that the condition for which surgery is done is not always cured or significantly improved, and in rare cases may be even worse.Ample time was given to answer all questions.  Barnett Applebaum, MD, [facility], [facility] 01/19/2020  8:20 AM

## 2020-01-19 NOTE — Patient Instructions (Signed)

## 2020-01-19 NOTE — Progress Notes (Signed)
PRE-OPERATIVE HISTORY AND PHYSICAL EXAM  HPI:  Cindy Martin is a 49 y.o. [phone] Patient's last menstrual period was 01/02/2020.; she is being admitted for surgery related to abnormal uterine bleeding and fibroids.  Periods are regular every 28-30 days, lasting 4 days. 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  Ultrasound reveals fibroids, at least 4, ranging from 2-4 cm in size.  PMHx: Past Medical History:  Diagnosis Date  . Menorrhagia    Past Surgical History:  Procedure Laterality Date  . TUBAL LIGATION     Family History  Problem Relation Age of Onset  . Breast cancer Neg Hx   . Ovarian cancer Neg Hx    Social History   Tobacco Use  . Smoking status: Never Smoker  . Smokeless tobacco: Never Used  Vaping Use  . Vaping Use: Never used  Substance Use Topics  . Alcohol use: No  . Drug use: No   No current outpatient medications on file. Allergies: Patient has no known allergies.  Review of Systems  Constitutional: Negative for chills, fever and malaise/fatigue.  HENT: Negative for congestion, sinus pain and sore throat.   Eyes: Negative for blurred vision and pain.  Respiratory: Negative for cough and wheezing.   Cardiovascular: Negative for chest pain and leg swelling.  Gastrointestinal: Negative for abdominal pain, constipation, diarrhea, heartburn, nausea and vomiting.  Genitourinary: Negative for dysuria, frequency, hematuria and urgency.  Musculoskeletal: Negative for back pain, joint pain, myalgias and neck pain.  Skin: Negative for itching and rash.  Neurological: Negative for dizziness, tremors and weakness.  Endo/Heme/Allergies: Does not bruise/bleed easily.  Psychiatric/Behavioral: Negative for depression. The patient is not nervous/anxious and does not have insomnia.     Objective: BP 130/80   Ht 5\' 9"  (1.753 m)   Wt 243 lb (110.2 kg)   LMP 01/02/2020   BMI 35.88 kg/m   Filed Weights     01/19/20 0817  Weight: 243 lb (110.2 kg)   Physical Exam Constitutional:      General: She is not in acute distress.    Appearance: She is well-developed.  HENT:     Head: Normocephalic and atraumatic. No laceration.     Right Ear: Hearing normal.     Left Ear: Hearing normal.     Mouth/Throat:     Pharynx: Uvula midline.  Eyes:     Pupils: Pupils are equal, round, and reactive to light.  Neck:     Thyroid: No thyromegaly.  Cardiovascular:     Rate and Rhythm: Normal rate and regular rhythm.     Heart sounds: No murmur heard.  No friction rub. No gallop.   Pulmonary:     Effort: Pulmonary effort is normal. No respiratory distress.     Breath sounds: Normal breath sounds. No wheezing.  Chest:     Breasts:        Right: No mass, skin change or tenderness.        Left: No mass, skin change or tenderness.  Abdominal:     General: Bowel sounds are normal. There is no distension.     Palpations: Abdomen is soft.     Tenderness: There is no abdominal tenderness. There is no rebound.  Musculoskeletal:        General: Normal range of motion.     Cervical back: Normal range of motion and neck supple.  Neurological:     Mental Status: She  is alert and oriented to person, place, and time.     Cranial Nerves: No cranial nerve deficit.  Skin:    General: Skin is warm and dry.  Psychiatric:        Judgment: Judgment normal.  Vitals reviewed.     Assessment: 1. Fibroid   2. Menorrhagia with regular cycle   Options discussed, plan TLH BS.  I have had a careful discussion with this patient about all the options available and the risk/benefits of each. I have fully informed this patient that surgery may subject her to a variety of discomforts and risks: She understands that most patients have surgery with little difficulty, but problems can happen ranging from minor to fatal. These include nausea, vomiting, pain, bleeding, infection, poor healing, hernia, or formation of adhesions.  Unexpected reactions may occur from any drug or anesthetic given. Unintended injury may occur to other pelvic or abdominal structures such as Fallopian tubes, ovaries, bladder, ureter (tube from kidney to bladder), or bowel. Nerves going from the pelvis to the legs may be injured. Any such injury may require immediate or later additional surgery to correct the problem. Excessive blood loss requiring transfusion is very unlikely but possible. Dangerous blood clots may form in the legs or lungs. Physical and sexual activity will be restricted in varying degrees for an indeterminate period of time but most often 2-6 weeks.  Finally, she understands that it is impossible to list every possible undesirable effect and that the condition for which surgery is done is not always cured or significantly improved, and in rare cases may be even worse.Ample time was given to answer all questions.  Barnett Applebaum, MD, [facility], [facility] 01/19/2020  8:20 AM

## 2020-01-23 ENCOUNTER — Other Ambulatory Visit: Payer: Self-pay

## 2020-01-23 ENCOUNTER — Encounter
Admission: RE | Admit: 2020-01-23 | Discharge: 2020-01-23 | Disposition: A | Discharge status: Home / Self Care | Payer: BC Managed Care – PPO | Source: Ambulatory Visit | Attending: Obstetrics & Gynecology | Admitting: Obstetrics & Gynecology

## 2020-01-23 DIAGNOSIS — Z01812 Encounter for preprocedural laboratory examination: Secondary | ICD-10-CM | POA: Diagnosis not present

## 2020-01-23 NOTE — Patient Instructions (Signed)
Your procedure is scheduled on: 01-30-20 TUESDAY Report to Same Day Surgery 2nd floor medical mall [facility] Entrance-take elevator on left to 2nd floor.  Check in with surgery information desk.) To find out your arrival time please call [phone] between 1PM - 3PM on 01-29-20 MONDAY  Remember: Instructions that are not followed completely may result in serious medical risk, up to and including death, or upon the discretion of your surgeon and anesthesiologist your surgery may need to be rescheduled.    _x___ 1. Do not eat food after midnight the night before your procedure. NO GUM OR CANDY AFTER MIDNIGHT. You may drink clear liquids up to 2 hours before you are scheduled to arrive at the hospital for your procedure.  Do not drink clear liquids within 2 hours of your scheduled arrival to the hospital.  Clear liquids include  --Water or Apple juice without pulp  --Clear carbohydrate beverage such as ClearFast or Gatorade  --Black Coffee or Clear Tea (No milk, no creamers, do not add anything to the coffee or Tea-ok to add sugar     __x__ 2. No Alcohol for 24 hours before or after surgery.   __x__3. No Smoking or e-cigarettes for 24 prior to surgery.  Do not use any chewable tobacco products for at least 6 hour prior to surgery   ____  4. Bring all medications with you on the day of surgery if instructed.    __x__ 5. Notify your doctor if there is any change in your medical condition     (cold, fever, infections).    x___6. On the morning of surgery brush your teeth with toothpaste and water.  You may rinse your mouth with mouth wash if you wish.  Do not swallow any toothpaste or mouthwash.   Do not wear jewelry, make-up, hairpins, clips or nail polish.  Do not wear lotions, powders, or perfumes.   Do not shave 48 hours prior to surgery. Men may shave face and neck.  Do not bring valuables to the hospital.    [facility] is not responsible for any belongings or valuables.                Contacts, dentures or bridgework may not be worn into surgery.  Leave your suitcase in the car. After surgery it may be brought to your room.  For patients admitted to the hospital, discharge time is determined by your treatment team.  _  Patients discharged the day of surgery will not be allowed to drive home.  You will need someone to drive you home and stay with you the night of your procedure.    Please read over the following fact sheets that you were given:   [facility] Preparing for Surgery/INCENTIVE SPIROMETER INSTRUCTIONS  ____ TAKE THE FOLLOWING MEDICATION THE MORNING OF SURGERY WITH A SMALL SIP OF WATER. These include:  1. NONE  2.  3.  4.  5.  6.  ____Fleets enema or Magnesium Citrate as directed.   _x___ Use CHG Soap or sage wipes as directed on instruction sheet   ____ Use inhalers on the day of surgery and bring to hospital day of surgery  ____ Stop Metformin and Janumet 2 days prior to surgery.    ____ Take 1/2 of usual insulin dose the night before surgery and none on the morning surgery.   ____ Follow recommendations from Cardiologist, Pulmonologist or PCP regarding stopping Aspirin, Coumadin, Plavix ,Eliquis, Effient, or Pradaxa, and Pletal.  X____Stop Anti-inflammatories  such as Advil, Aleve, Ibuprofen, Motrin, Naproxen, Naprosyn, Goodies powders or aspirin products NOW-OK to take Tylenol    ____ Stop supplements until after surgery.    ____ Bring C-Pap to the hospital.

## 2020-01-26 ENCOUNTER — Other Ambulatory Visit: Payer: Self-pay

## 2020-01-26 ENCOUNTER — Other Ambulatory Visit
Admission: RE | Admit: 2020-01-26 | Discharge: 2020-01-26 | Disposition: A | Discharge status: Home / Self Care | Payer: BC Managed Care – PPO | Source: Ambulatory Visit | Attending: Obstetrics & Gynecology | Admitting: Obstetrics & Gynecology

## 2020-01-26 DIAGNOSIS — Z01812 Encounter for preprocedural laboratory examination: Secondary | ICD-10-CM | POA: Insufficient documentation

## 2020-01-26 DIAGNOSIS — Z20822 Contact with and (suspected) exposure to covid-19: Secondary | ICD-10-CM | POA: Insufficient documentation

## 2020-01-26 LAB — TYPE AND SCREEN
ABO/RH(D): O POS
Antibody Screen: NEGATIVE

## 2020-01-26 LAB — CBC
HCT: 38.9 % (ref 36.0–46.0)
Hemoglobin: 12.3 g/dL (ref 12.0–15.0)
MCH: 27.4 pg (ref 26.0–34.0)
MCHC: 31.6 g/dL (ref 30.0–36.0)
MCV: 86.6 fL (ref 80.0–100.0)
Platelets: 270 10*3/uL (ref 150–400)
RBC: 4.49 MIL/uL (ref 3.87–5.11)
RDW: 14.7 % (ref 11.5–15.5)
WBC: 6.5 10*3/uL (ref 4.0–10.5)
nRBC: 0 % (ref 0.0–0.2)

## 2020-01-27 LAB — SARS CORONAVIRUS 2 (TAT 6-24 HRS): SARS Coronavirus 2: NEGATIVE

## 2020-01-30 ENCOUNTER — Ambulatory Visit: Payer: BC Managed Care – PPO | Admitting: Certified Registered"

## 2020-01-30 ENCOUNTER — Other Ambulatory Visit: Payer: Self-pay

## 2020-01-30 ENCOUNTER — Encounter: Payer: Self-pay | Admitting: Obstetrics & Gynecology

## 2020-01-30 ENCOUNTER — Encounter
Admission: RE | Disposition: A | Discharge status: Home / Self Care | Payer: Self-pay | Source: Home / Self Care | Attending: Obstetrics & Gynecology

## 2020-01-30 ENCOUNTER — Ambulatory Visit
Admission: RE | Admit: 2020-01-30 | Discharge: 2020-01-30 | Disposition: A | Discharge status: Home / Self Care | Payer: BC Managed Care – PPO | Attending: Obstetrics & Gynecology | Admitting: Obstetrics & Gynecology

## 2020-01-30 DIAGNOSIS — D219 Benign neoplasm of connective and other soft tissue, unspecified: Secondary | ICD-10-CM | POA: Diagnosis not present

## 2020-01-30 DIAGNOSIS — D259 Leiomyoma of uterus, unspecified: Secondary | ICD-10-CM | POA: Insufficient documentation

## 2020-01-30 DIAGNOSIS — N920 Excessive and frequent menstruation with regular cycle: Secondary | ICD-10-CM | POA: Diagnosis not present

## 2020-01-30 DIAGNOSIS — Z6835 Body mass index (BMI) 35.0-35.9, adult: Secondary | ICD-10-CM | POA: Diagnosis not present

## 2020-01-30 DIAGNOSIS — E669 Obesity, unspecified: Secondary | ICD-10-CM | POA: Diagnosis not present

## 2020-01-30 HISTORY — PX: CYSTOSCOPY: SHX5120

## 2020-01-30 HISTORY — PX: TOTAL LAPAROSCOPIC HYSTERECTOMY WITH SALPINGECTOMY: SHX6742

## 2020-01-30 LAB — ABO/RH: ABO/RH(D): O POS

## 2020-01-30 LAB — POCT PREGNANCY, URINE: Preg Test, Ur: NEGATIVE

## 2020-01-30 SURGERY — HYSTERECTOMY, TOTAL, LAPAROSCOPIC, WITH SALPINGECTOMY
Anesthesia: General | Site: Urethra

## 2020-01-30 MED ORDER — SODIUM CHLORIDE 0.9 % IR SOLN
Status: DC | PRN
  Administered 2020-01-30: 1000 mL

## 2020-01-30 MED ORDER — LACTATED RINGERS IR SOLN
Status: DC | PRN
  Administered 2020-01-30: 1000 mL

## 2020-01-30 MED ORDER — DEXAMETHASONE SODIUM PHOSPHATE 10 MG/ML IJ SOLN
INTRAMUSCULAR | Status: DC | PRN
  Administered 2020-01-30: 5 mg via INTRAVENOUS

## 2020-01-30 MED ORDER — ACETAMINOPHEN 10 MG/ML IV SOLN
INTRAVENOUS | Status: DC | PRN
  Administered 2020-01-30: 1000 mg via INTRAVENOUS

## 2020-01-30 MED ORDER — SODIUM CHLORIDE 0.9 % IV SOLN
2.0000 g | INTRAVENOUS | Status: AC
  Administered 2020-01-30: 2 g via INTRAVENOUS

## 2020-01-30 MED ORDER — FENTANYL CITRATE (PF) 100 MCG/2ML IJ SOLN
INTRAMUSCULAR | Status: AC
  Filled 2020-01-30: qty 2

## 2020-01-30 MED ORDER — FENTANYL CITRATE (PF) 100 MCG/2ML IJ SOLN
INTRAMUSCULAR | Status: DC | PRN
  Administered 2020-01-30: 50 ug via INTRAVENOUS
  Administered 2020-01-30: 25 ug via INTRAVENOUS
  Administered 2020-01-30: 75 ug via INTRAVENOUS

## 2020-01-30 MED ORDER — FAMOTIDINE 20 MG PO TABS
ORAL_TABLET | ORAL | Status: AC
  Administered 2020-01-30: 20 mg via ORAL
  Filled 2020-01-30: qty 1

## 2020-01-30 MED ORDER — LACTATED RINGERS IV SOLN: INTRAVENOUS | Status: DC

## 2020-01-30 MED ORDER — CHLORHEXIDINE GLUCONATE 0.12 % MT SOLN
OROMUCOSAL | Status: AC
  Administered 2020-01-30: 15 mL via OROMUCOSAL
  Filled 2020-01-30: qty 15

## 2020-01-30 MED ORDER — PROPOFOL 10 MG/ML IV BOLUS
INTRAVENOUS | Status: AC
  Filled 2020-01-30: qty 20

## 2020-01-30 MED ORDER — DEXAMETHASONE SODIUM PHOSPHATE 10 MG/ML IJ SOLN
INTRAMUSCULAR | Status: AC
  Filled 2020-01-30: qty 1

## 2020-01-30 MED ORDER — FENTANYL CITRATE (PF) 100 MCG/2ML IJ SOLN
25.0000 ug | INTRAMUSCULAR | Status: DC | PRN
  Administered 2020-01-30 (×4): 25 ug via INTRAVENOUS

## 2020-01-30 MED ORDER — ONDANSETRON HCL 4 MG/2ML IJ SOLN
INTRAMUSCULAR | Status: AC
  Filled 2020-01-30: qty 2

## 2020-01-30 MED ORDER — SUGAMMADEX SODIUM 200 MG/2ML IV SOLN
INTRAVENOUS | Status: DC | PRN
  Administered 2020-01-30: 300 mg via INTRAVENOUS

## 2020-01-30 MED ORDER — ROCURONIUM BROMIDE 10 MG/ML (PF) SYRINGE
PREFILLED_SYRINGE | INTRAVENOUS | Status: AC
  Filled 2020-01-30: qty 10

## 2020-01-30 MED ORDER — FAMOTIDINE 20 MG PO TABS: 20.0000 mg | ORAL_TABLET | Freq: Once | ORAL | Status: AC

## 2020-01-30 MED ORDER — OXYCODONE-ACETAMINOPHEN 5-325 MG PO TABS: 1.0000 | ORAL_TABLET | ORAL | Status: DC | PRN

## 2020-01-30 MED ORDER — BUPIVACAINE HCL (PF) 0.5 % IJ SOLN
INTRAMUSCULAR | Status: DC | PRN
  Administered 2020-01-30: 15 mL

## 2020-01-30 MED ORDER — PROPOFOL 10 MG/ML IV BOLUS
INTRAVENOUS | Status: DC | PRN
  Administered 2020-01-30: 200 mg via INTRAVENOUS

## 2020-01-30 MED ORDER — ACETAMINOPHEN 10 MG/ML IV SOLN
INTRAVENOUS | Status: AC
  Filled 2020-01-30: qty 100

## 2020-01-30 MED ORDER — POVIDONE-IODINE 10 % EX SWAB: 2.0000 "application " | Freq: Once | CUTANEOUS | Status: DC

## 2020-01-30 MED ORDER — MORPHINE SULFATE (PF) 2 MG/ML IV SOLN: 1.0000 mg | INTRAVENOUS | Status: DC | PRN

## 2020-01-30 MED ORDER — OXYCODONE HCL 5 MG PO TABS
5.0000 mg | ORAL_TABLET | Freq: Once | ORAL | Status: AC | PRN
  Administered 2020-01-30: 5 mg via ORAL

## 2020-01-30 MED ORDER — CHLORHEXIDINE GLUCONATE 0.12 % MT SOLN: 15.0000 mL | Freq: Once | OROMUCOSAL | Status: AC

## 2020-01-30 MED ORDER — OXYCODONE-ACETAMINOPHEN 5-325 MG PO TABS: 1.0000 | ORAL_TABLET | ORAL | 0 refills | Status: DC | PRN

## 2020-01-30 MED ORDER — 0.9 % SODIUM CHLORIDE (POUR BTL) OPTIME
TOPICAL | Status: DC | PRN
  Administered 2020-01-30: 500 mL

## 2020-01-30 MED ORDER — SODIUM CHLORIDE 0.9 % IV SOLN
INTRAVENOUS | Status: AC
  Filled 2020-01-30: qty 2

## 2020-01-30 MED ORDER — MIDAZOLAM HCL 2 MG/2ML IJ SOLN
INTRAMUSCULAR | Status: DC | PRN
  Administered 2020-01-30: 2 mg via INTRAVENOUS

## 2020-01-30 MED ORDER — HEMOSTATIC AGENTS (NO CHARGE) OPTIME
TOPICAL | Status: DC | PRN
  Administered 2020-01-30: 1 via TOPICAL

## 2020-01-30 MED ORDER — PROPOFOL 500 MG/50ML IV EMUL
INTRAVENOUS | Status: DC | PRN
  Administered 2020-01-30: 50 ug/kg/min via INTRAVENOUS

## 2020-01-30 MED ORDER — ORAL CARE MOUTH RINSE: 15.0000 mL | Freq: Once | OROMUCOSAL | Status: AC

## 2020-01-30 MED ORDER — SEVOFLURANE IN SOLN
RESPIRATORY_TRACT | Status: AC
  Filled 2020-01-30: qty 250

## 2020-01-30 MED ORDER — LIDOCAINE HCL (CARDIAC) PF 100 MG/5ML IV SOSY
PREFILLED_SYRINGE | INTRAVENOUS | Status: DC | PRN
  Administered 2020-01-30: 100 mg via INTRAVENOUS

## 2020-01-30 MED ORDER — ONDANSETRON HCL 4 MG/2ML IJ SOLN
INTRAMUSCULAR | Status: DC | PRN
  Administered 2020-01-30: 4 mg via INTRAVENOUS

## 2020-01-30 MED ORDER — ACETAMINOPHEN 650 MG RE SUPP
650.0000 mg | RECTAL | Status: DC | PRN
  Filled 2020-01-30: qty 1

## 2020-01-30 MED ORDER — OXYCODONE HCL 5 MG/5ML PO SOLN: 5.0000 mg | Freq: Once | ORAL | Status: AC | PRN

## 2020-01-30 MED ORDER — ROCURONIUM BROMIDE 100 MG/10ML IV SOLN
INTRAVENOUS | Status: DC | PRN
  Administered 2020-01-30 (×2): 10 mg via INTRAVENOUS
  Administered 2020-01-30: 60 mg via INTRAVENOUS

## 2020-01-30 MED ORDER — FENTANYL CITRATE (PF) 250 MCG/5ML IJ SOLN
INTRAMUSCULAR | Status: AC
  Filled 2020-01-30: qty 5

## 2020-01-30 MED ORDER — ACETAMINOPHEN 325 MG PO TABS: 650.0000 mg | ORAL_TABLET | ORAL | Status: DC | PRN

## 2020-01-30 MED ORDER — MIDAZOLAM HCL 2 MG/2ML IJ SOLN
INTRAMUSCULAR | Status: AC
  Filled 2020-01-30: qty 2

## 2020-01-30 MED ORDER — OXYCODONE HCL 5 MG PO TABS
ORAL_TABLET | ORAL | Status: AC
  Filled 2020-01-30: qty 1

## 2020-01-30 MED ORDER — ONDANSETRON HCL 4 MG/2ML IJ SOLN: 4.0000 mg | Freq: Once | INTRAMUSCULAR | Status: DC | PRN

## 2020-01-30 SURGICAL SUPPLY — 54 items
APPLICATOR ARISTA FLEXITIP XL (MISCELLANEOUS) ×4 IMPLANT
BAG URINE DRAIN 2000ML AR STRL (UROLOGICAL SUPPLIES) ×4 IMPLANT
BLADE SURG SZ11 CARB STEEL (BLADE) ×4 IMPLANT
CANISTER SUCT 1200ML W/VALVE (MISCELLANEOUS) ×4 IMPLANT
CATH FOLEY 2WAY  5CC 16FR (CATHETERS) ×2
CATH URTH 16FR FL 2W BLN LF (CATHETERS) ×2 IMPLANT
CHLORAPREP W/TINT 26 (MISCELLANEOUS) ×4 IMPLANT
COVER WAND RF STERILE (DRAPES) ×4 IMPLANT
DEFOGGER SCOPE WARMER CLEARIFY (MISCELLANEOUS) ×4 IMPLANT
DERMABOND ADVANCED (GAUZE/BANDAGES/DRESSINGS) ×2
DERMABOND ADVANCED .7 DNX12 (GAUZE/BANDAGES/DRESSINGS) ×2 IMPLANT
DEVICE SUTURE ENDOST 10MM (ENDOMECHANICALS) ×4 IMPLANT
DRAPE CAMERA CLOSED 9X96 (DRAPES) ×4 IMPLANT
DRSG TEGADERM 2-3/8X2-3/4 SM (GAUZE/BANDAGES/DRESSINGS) IMPLANT
GAUZE 4X4 16PLY RFD (DISPOSABLE) ×4 IMPLANT
GLOVE BIO SURGEON STRL SZ8 (GLOVE) ×24 IMPLANT
GLOVE INDICATOR 8.0 STRL GRN (GLOVE) ×8 IMPLANT
GOWN STRL REUS W/ TWL LRG LVL3 (GOWN DISPOSABLE) ×8 IMPLANT
GOWN STRL REUS W/ TWL XL LVL3 (GOWN DISPOSABLE) ×4 IMPLANT
GOWN STRL REUS W/TWL LRG LVL3 (GOWN DISPOSABLE) ×8
GOWN STRL REUS W/TWL XL LVL3 (GOWN DISPOSABLE) ×4
GRASPER SUT TROCAR 14GX15 (MISCELLANEOUS) ×4 IMPLANT
HEMOSTAT ARISTA ABSORB 3G PWDR (HEMOSTASIS) ×4 IMPLANT
IRRIGATION STRYKERFLOW (MISCELLANEOUS) ×2 IMPLANT
IRRIGATOR STRYKERFLOW (MISCELLANEOUS) ×4
IV LACTATED RINGERS 1000ML (IV SOLUTION) ×8 IMPLANT
KIT PINK PAD W/HEAD ARE REST (MISCELLANEOUS) ×4
KIT PINK PAD W/HEAD ARM REST (MISCELLANEOUS) ×2 IMPLANT
KIT TURNOVER CYSTO (KITS) ×4 IMPLANT
LABEL OR SOLS (LABEL) ×4 IMPLANT
MANIPULATOR VCARE LG CRV RETR (MISCELLANEOUS) ×4 IMPLANT
MANIPULATOR VCARE SML CRV RETR (MISCELLANEOUS) IMPLANT
MANIPULATOR VCARE STD CRV RETR (MISCELLANEOUS) IMPLANT
NEEDLE VERESS 14GA 120MM (NEEDLE) ×4 IMPLANT
NS IRRIG 500ML POUR BTL (IV SOLUTION) ×4 IMPLANT
OCCLUDER COLPOPNEUMO (BALLOONS) ×4 IMPLANT
PACK GYN LAPAROSCOPIC (MISCELLANEOUS) ×4 IMPLANT
PAD OB MATERNITY 4.3X12.25 (PERSONAL CARE ITEMS) ×4 IMPLANT
PAD PREP 24X41 OB/GYN DISP (PERSONAL CARE ITEMS) ×4 IMPLANT
PORT ACCESS TROCAR AIRSEAL 12 (TROCAR) ×2 IMPLANT
PORT ACCESS TROCAR AIRSEAL 5M (TROCAR) ×2
SCISSORS METZENBAUM CVD 33 (INSTRUMENTS) ×4 IMPLANT
SET CYSTO W/LG BORE CLAMP LF (SET/KITS/TRAYS/PACK) ×4 IMPLANT
SET TRI-LUMEN FLTR TB AIRSEAL (TUBING) ×4 IMPLANT
SHEARS HARMONIC ACE PLUS 36CM (ENDOMECHANICALS) ×4 IMPLANT
SLEEVE ENDOPATH XCEL 5M (ENDOMECHANICALS) ×4 IMPLANT
SPONGE GAUZE 2X2 8PLY STER LF (GAUZE/BANDAGES/DRESSINGS)
SPONGE GAUZE 2X2 8PLY STRL LF (GAUZE/BANDAGES/DRESSINGS) IMPLANT
SUT ENDO VLOC 180-0-8IN (SUTURE) ×4 IMPLANT
SUT VIC AB 0 CT1 36 (SUTURE) ×4 IMPLANT
SUT VIC AB 4-0 FS2 27 (SUTURE) ×4 IMPLANT
SYR 10ML LL (SYRINGE) ×4 IMPLANT
SYR 50ML LL SCALE MARK (SYRINGE) ×4 IMPLANT
TROCAR XCEL NON-BLD 5MMX100MML (ENDOMECHANICALS) ×4 IMPLANT

## 2020-01-30 NOTE — Discharge Instructions (Signed)
AMBULATORY SURGERY  DISCHARGE INSTRUCTIONS   1) The drugs that you were given will stay in your system until tomorrow so for the next 24 hours you should not:  A) Drive an automobile B) Make any legal decisions C) Drink any alcoholic beverage   2) You may resume regular meals tomorrow.  Today it is better to start with liquids and gradually work up to solid foods.  You may eat anything you prefer, but it is better to start with liquids, then soup and crackers, and gradually work up to solid foods.   3) Please notify your doctor immediately if you have any unusual bleeding, trouble breathing, redness and pain at the surgery site, drainage, fever, or pain not relieved by medication.  4) Your post-operative visit with Dr.                                     is: Date:                        Time:    Please call to schedule your post-operative visit.  5) Additional Instructions: Total Laparoscopic Hysterectomy, Care After This sheet gives you information about how to care for yourself after your procedure. Your health care provider may also give you more specific instructions. If you have problems or questions, contact your health care provider. What can I expect after the procedure? After the procedure, it is common to have:  Pain and bruising around your incisions.  A sore throat, if a breathing tube was used during surgery.  Fatigue.  Poor appetite.  Less interest in sex. If your ovaries were also removed, it is also common to have symptoms of menopause such as hot flashes, night sweats, and lack of sleep (insomnia). Follow these instructions at home: Bathing  Do not take baths, swim, or use a hot tub until your health care provider approves. You may need to only take showers for 2-3 weeks.  Keep your bandage (dressing) dry until your health care provider says it can be removed. Incision care   Follow instructions from your health care provider about how to take care of  your incisions. Make sure you: ? Wash your hands with soap and water before you change your dressing. If soap and water are not available, use hand sanitizer. ? Change your dressing as told by your health care provider. ? Leave stitches (sutures), skin glue, or adhesive strips in place. These skin closures may need to stay in place for 2 weeks or longer. If adhesive strip edges start to loosen and curl up, you may trim the loose edges. Do not remove adhesive strips completely unless your health care provider tells you to do that.  Check your incision area every day for signs of infection. Check for: ? Redness, swelling, or pain. ? Fluid or blood. ? Warmth. ? Pus or a bad smell. Activity  Get plenty of rest and sleep.  Do not lift anything that is heavier than 10 lbs (4.5 kg) for one month after surgery, or as long as told by your health care provider.  Do not drive or use heavy machinery while taking prescription pain medicine.  Do not drive for 24 hours if you were given a medicine to help you relax (sedative).  Return to your normal activities as told by your health care provider. Ask your health care provider what  activities are safe for you. Lifestyle   Do not use any products that contain nicotine or tobacco, such as cigarettes and e-cigarettes. These can delay healing. If you need help quitting, ask your health care provider.  Do not drink alcohol until your health care provider approves. General instructions  Do not douche, use tampons, or have sex for at least 6 weeks, or as told by your health care provider.  Take over-the-counter and prescription medicines only as told by your health care provider.  To monitor yourself for a fever, take your temperature at least once a day during recovery.  If you struggle with physical or emotional changes after your procedure, speak with your health care provider or a therapist.  To prevent or treat constipation while you are taking  prescription pain medicine, your health care provider may recommend that you: ? Drink enough fluid to keep your urine clear or pale yellow. ? Take over-the-counter or prescription medicines. ? Eat foods that are high in fiber, such as fresh fruits and vegetables, whole grains, and beans. ? Limit foods that are high in fat and processed sugars, such as fried and sweet foods.  Keep all follow-up visits as told by your health care provider. This is important. Contact a health care provider if:  You have chills or a fever.  You have redness, swelling, or pain around an incision.  You have fluid or blood coming from an incision.  Your incision feels warm to the touch.  You have pus or a bad smell coming from an incision.  An incision breaks open.  You feel dizzy or light-headed.  You have pain or bleeding when you urinate.  You have diarrhea, nausea, or vomiting that does not go away.  You have abnormal vaginal discharge.  You have a rash.  You have pain that does not get better with medicine. Get help right away if:  You have a fever and your symptoms suddenly get worse.  You have severe abdominal pain.  You have chest pain.  You have shortness of breath.  You faint.  You have pain, swelling, or redness on your leg.  You have heavy vaginal bleeding with blood clots. Summary  After the procedure it is common to have abdominal pain. Your provider will give you medication for this.  Do not take baths, swim, or use a hot tub until your health care provider approves.  Do not lift anything that is heavier than 10 lbs (4.5 kg) for one month after surgery, or as long as told by your health care provider.  Notify your provider if you have any signs or symptoms of infection after the procedure. This information is not intended to replace advice given to you by your health care provider. Make sure you discuss any questions you have with your health care provider. Document  Revised: 06/18/2017 Document Reviewed: 09/16/2016 Elsevier Patient Education  2020 [employer].

## 2020-01-30 NOTE — Anesthesia Preprocedure Evaluation (Addendum)
Anesthesia Evaluation  Patient identified by MRN, date of birth, ID band Patient awake    Reviewed: Allergy & Precautions, H&P , NPO status , Patient's Chart, lab work & pertinent test results  Airway Mallampati: I  TM Distance: >3 FB Neck ROM: full    Dental  (+) Teeth Intact   Pulmonary neg pulmonary ROS, neg COPD,    breath sounds clear to auscultation       Cardiovascular (-) hypertension(-) Past MI and (-) Cardiac Stents negative cardio ROS  (-) dysrhythmias  Rhythm:regular Rate:Normal     Neuro/Psych negative neurological ROS  negative psych ROS   GI/Hepatic negative GI ROS, Neg liver ROS,   Endo/Other  negative endocrine ROS  Renal/GU      Musculoskeletal   Abdominal   Peds  Hematology negative hematology ROS (+)   Anesthesia Other Findings Obese  Past Medical History: No date: Menorrhagia  Past Surgical History: No date: TUBAL LIGATION     Reproductive/Obstetrics negative OB ROS                           Anesthesia Physical Anesthesia Plan  ASA: II  Anesthesia Plan: General ETT   Post-op Pain Management:    Induction:   PONV Risk Score and Plan: 4 or greater and Ondansetron, Dexamethasone, Midazolam, Treatment may vary due to age or medical condition and Propofol infusion  Airway Management Planned:   Additional Equipment:   Intra-op Plan:   Post-operative Plan:   Informed Consent: I have reviewed the patients History and Physical, chart, labs and discussed the procedure including the risks, benefits and alternatives for the proposed anesthesia with the patient or authorized representative who has indicated his/her understanding and acceptance.     Dental Advisory Given  Plan Discussed with: Anesthesiologist, CRNA and Surgeon  Anesthesia Plan Comments:        Anesthesia Quick Evaluation

## 2020-01-30 NOTE — Transfer of Care (Signed)
Immediate Anesthesia Transfer of Care Note  Patient: Cindy Martin  Procedure(s) Performed: TOTAL LAPAROSCOPIC HYSTERECTOMY WITH RIGHT SALPINGECTOMY (Bilateral Abdomen) CYSTOSCOPY (N/A Urethra)  Patient Location: PACU  Anesthesia Type:General  Level of Consciousness: awake, alert  and oriented  Airway & Oxygen Therapy: Patient Spontanous Breathing and Patient connected to face mask oxygen  Post-op Assessment: Report given to RN and Post -op Vital signs reviewed and stable  Post vital signs: Reviewed and stable  Last Vitals:  Vitals Value Taken Time  BP 131/86 01/30/20 1538  Temp    Pulse 62 01/30/20 1540  Resp 15 01/30/20 1540  SpO2 100 % 01/30/20 1540  Vitals shown include unvalidated device data.  Last Pain:  Vitals:   01/30/20 1537  TempSrc:   PainSc: (P) Asleep         Complications: No complications documented.

## 2020-01-30 NOTE — Op Note (Signed)
Operative Report:  PRE-OP DIAGNOSIS: Fibroid D21.9 Menorrhagia with regular cycle N92.0   POST-OP DIAGNOSIS: Fibroid D21.9 Menorrhagia with regular cycle N92.0   PROCEDURE: Procedure(s): TOTAL LAPAROSCOPIC HYSTERECTOMY WITH RIGHT SALPINGECTOMY and PARTIAL LEFT SALPINGECTOMY CYSTOSCOPY  SURGEON: Barnett Applebaum, MD, FACOG  ASSISTANT: Dr Glennon Mac, No other capable assistant available, in surgery requiring high level assistant.  ANESTHESIA: General endotracheal anesthesia  ESTIMATED BLOOD LOSS: less than 50   SPECIMENS: Uterus, Tubes.  COMPLICATIONS: None  DISPOSITION: stable to PACU  FINDINGS: Intraabdominal adhesions were noted along the left tube to omentum and side wall Normal ovaries  PROCEDURE:  The patient was taken to the OR where anesthesia was administed. She was prepped and draped in the normal sterile fashion in the dorsal lithotomy position in the [place] stirrups. A time out was performed. A Graves speculum was inserted, the cervix was grasped with a single tooth tenaculum and the endometrial cavity was sounded. The cervix was progressively dilated to a size 18 [place] with [employer] dilators. A V-Care uterine manipulator was inserted in the usual fashion without incident. Gloves were changed and attention was turned to the abdomen.   An infraumbilical transverse 14mm skin incision was made with the scalpel after local anesthesia applied to the skin. A Veress-step needle was inserted in the usual fashion and confirmed using the hanging drop technique. A pneumoperitoneum was obtained by insufflation of CO2 (opening pressure of 58mmHg) to 40mmHg. A diagnostic laparoscopy was performed yielding the previously described findings. Attention was turned to the left lower quadrant where after visualization of the inferior epigastric vessels a 64mm skin incision was made with the scalpel. A 5 mm laparoscopic port was inserted. The same procedure was repeated in the right lower quadrant with a 50mm  trocar. Attention was turned to the left aspect of the uterus, where after visualization of the ureter, the round ligament was coagulated and transected using the 61mm Harmonic Scapel. The anterior and posterior leafs of the broad ligament were dissected off as the anterior one was coagulated and transected in a caudal direction towards the cuff of the uterine manipulator.  Attention was then turned to the right fallopian tube which was recognized by visualization of the fimbria. The tube is excised to its attachment to the uterus. The uterine-ovarian ligament and its blood vessels were carefully coagulated and transected using the Harmonic scapel.  Attention was turned to the left aspect of the uterus where the same procedure was performed with only the distal (uterine side) portion of tube excised as the fimbriated end was emeshed in adhesions with no benefit seen in dissection free, compared to risk.  The vesicouterine reflection of the peritoneum was dissected with the harmonic scapel and the bladder flap was created bluntly.  The uterine vessels were coagulated and transected bilaterally using first bipolar cautery and then the harmonic scapel. A 360 degree, circumferential colpotomy was done to completely amputate the uterus with cervix and tubes. Once the specimen was amputated it was delivered through the vagina.   The colpotomy was repaired in a simple running fashion using a delayed absorbable suture with an endo-stitch device.  Vaginal exam confirms complete closure.  The cavity was copiously irrigated. A survey of the pelvic cavity revealed adequate hemostasis and no injury to bowel, bladder, or ureter.   A diagnostic cystoscopy was performed using saline distension of bladder with no lesions or injuries noted.  Bilateral urine flow from each ureteral orifice is visualized.  At this point the procedure was finalized. Right  lower quadrant fascia incision is closed with a vicryl suture using the  fascia closure device. All the instruments were removed from the patient's body. Gas was expelled and patient is leveled.  Incisions are closed with skin adhesive.  The assistance of my assisting-physician was vital to resect and retract interchangably with self on each side.  Patient goes to recovery room in stable condition.  All sponge, instrument, and needle counts are correct x2.     Barnett Applebaum, MD, [facility], [facility] 01/30/2020  3:25 PM

## 2020-01-30 NOTE — Anesthesia Procedure Notes (Signed)
Procedure Name: Intubation Date/Time: 01/30/2020 1:42 PM Performed by: Chanetta Marshall, CRNA Pre-anesthesia Checklist: Patient identified, Emergency Drugs available, Suction available and Patient being monitored Patient Re-evaluated:Patient Re-evaluated prior to induction Oxygen Delivery Method: Circle system utilized Preoxygenation: Pre-oxygenation with 100% oxygen Induction Type: IV induction Ventilation: Mask ventilation without difficulty Laryngoscope Size: McGraph and 3 Grade View: Grade II Tube type: Oral Tube size: 7.5 mm Number of attempts: 1 Airway Equipment and Method: Stylet and Video-laryngoscopy Placement Confirmation: ETT inserted through vocal cords under direct vision,  positive ETCO2,  breath sounds checked- equal and bilateral and CO2 detector Secured at: 21 cm Tube secured with: Tape Dental Injury: Teeth and Oropharynx as per pre-operative assessment

## 2020-01-30 NOTE — Interval H&P Note (Signed)
History and Physical Interval Note:  01/30/2020 12:58 PM  Cindy Martin  has presented today for surgery, with the diagnosis of Fibroid D21.9 Menorrhagia with regular cycle N92.0.  The various methods of treatment have been discussed with the patient and family. After consideration of risks, benefits and other options for treatment, the patient has consented to  Procedure(s): TOTAL LAPAROSCOPIC HYSTERECTOMY WITH SALPINGECTOMY (Bilateral) as a surgical intervention.  The patient's history has been reviewed, patient examined, no change in status, stable for surgery.  I have reviewed the patient's chart and labs.  Questions were answered to the patient's satisfaction.     Hoyt Koch

## 2020-01-31 ENCOUNTER — Encounter: Payer: Self-pay | Admitting: Obstetrics & Gynecology

## 2020-01-31 NOTE — Anesthesia Postprocedure Evaluation (Signed)
Anesthesia Post Note  Patient: Cindy Martin  Procedure(s) Performed: TOTAL LAPAROSCOPIC HYSTERECTOMY WITH RIGHT SALPINGECTOMY (Bilateral Abdomen) CYSTOSCOPY (N/A Urethra)  Patient location during evaluation: PACU Anesthesia Type: General Level of consciousness: awake and alert Pain management: pain level controlled Vital Signs Assessment: post-procedure vital signs reviewed and stable Respiratory status: spontaneous breathing, nonlabored ventilation and respiratory function stable Cardiovascular status: blood pressure returned to baseline and stable Postop Assessment: no apparent nausea or vomiting Anesthetic complications: no   No complications documented.   Last Vitals:  Vitals:   01/30/20 1630 01/30/20 1644  BP: 127/73 127/80  Pulse: 69 63  Resp: 12 18  Temp: (!) 36.2 C (!) 36.2 C  SpO2: 100% 100%    Last Pain:  Vitals:   01/31/20 0834  TempSrc:   PainSc: 0-No pain                 Tera Mater

## 2020-02-01 LAB — SURGICAL PATHOLOGY

## 2020-02-12 ENCOUNTER — Other Ambulatory Visit: Payer: Self-pay

## 2020-02-12 ENCOUNTER — Ambulatory Visit (INDEPENDENT_AMBULATORY_CARE_PROVIDER_SITE_OTHER): Payer: BC Managed Care – PPO | Admitting: Obstetrics & Gynecology

## 2020-02-12 ENCOUNTER — Encounter: Payer: Self-pay | Admitting: Obstetrics & Gynecology

## 2020-02-12 VITALS — BP 130/80 | Ht 69.0 in | Wt 243.0 lb

## 2020-02-12 DIAGNOSIS — Z90710 Acquired absence of both cervix and uterus: Secondary | ICD-10-CM

## 2020-02-12 NOTE — Progress Notes (Signed)
°  Postoperative Follow-up Patient presents post op from [facility] for abnormal uterine bleeding and fibroids, 2 weeks ago. Path DIAGNOSIS:  A. UTERUS WITH CERVIX AND RIGHT FALLOPIAN TUBE; TOTAL HYSTERECTOMY WITH  RIGHT SALPINGECTOMY:  - CERVIX:    - NEGATIVE FOR DYSPLASIA AND MALIGNANCY.  - ENDOMETRIUM:    - WEAKLY PROLIFERATIVE. NEGATIVE FOR ATYPIA / EIN AND MALIGNANCY.  - MYOMETRIUM:    - LEIOMYOMATA.  - FALLOPIAN TUBE:    - NO SIGNIFICANT PATHOLOGIC ALTERATION.  Subjective: Patient reports marked improvement in her preop symptoms. Eating a regular diet without difficulty. The patient is not having any pain.  Activity: normal activities of daily living. Patient reports additional symptom's since surgery of None.  Objective: BP (!) 130/80    Ht 5\' 9"  (1.753 m)    Wt (!) 243 lb (110.2 kg)    BMI 35.88 kg/m  Physical Exam Constitutional:      General: She is not in acute distress.    Appearance: She is well-developed.  Cardiovascular:     Rate and Rhythm: Normal rate.  Pulmonary:     Effort: Pulmonary effort is normal.  Abdominal:     General: There is no distension.     Palpations: Abdomen is soft.     Tenderness: There is no abdominal tenderness.     Comments: Incision Healing Well   Musculoskeletal:        General: Normal range of motion.  Neurological:     Mental Status: She is alert and oriented to person, place, and time.     Cranial Nerves: No cranial nerve deficit.  Skin:    General: Skin is warm and dry.     Assessment: s/p :  total laparoscopic hysterectomy with bilateral salpingectomy stable  Plan: Patient has done well after surgery with no apparent complications.  I have discussed the post-operative course to date, and the expected progress moving forward.  The patient understands what complications to be concerned about.  I will see the patient in routine follow up, or sooner if needed.    Activity plan: No restriction..  Pelvic rest.  Hoyt Koch 02/12/2020, 9:37 AM

## 2020-03-11 ENCOUNTER — Other Ambulatory Visit: Payer: Self-pay

## 2020-03-11 ENCOUNTER — Ambulatory Visit (INDEPENDENT_AMBULATORY_CARE_PROVIDER_SITE_OTHER): Payer: BC Managed Care – PPO | Admitting: Obstetrics & Gynecology

## 2020-03-11 ENCOUNTER — Encounter: Payer: Self-pay | Admitting: Obstetrics & Gynecology

## 2020-03-11 VITALS — BP 150/100 | Ht 69.0 in | Wt 244.0 lb

## 2020-03-11 DIAGNOSIS — Z90710 Acquired absence of both cervix and uterus: Secondary | ICD-10-CM

## 2020-03-11 NOTE — Progress Notes (Signed)
  Postoperative Follow-up Patient presents post op from [facility] for abnormal uterine bleeding and fibroids, 6 weeks ago.  Subjective: Patient reports marked improvement in her preop symptoms. Eating a regular diet without difficulty. The patient is not having any pain.  Activity: normal activities of daily living. Patient reports additional symptom's since surgery of brown like d/c last 2 days  Objective: BP (!) 150/100   Ht 5\' 9"  (1.753 m)   Wt 244 lb (110.7 kg)   LMP 01/02/2020 (Exact Date)   BMI 36.03 kg/m  Physical Exam Constitutional:      General: She is not in acute distress.    Appearance: She is well-developed.  Genitourinary:     Pelvic exam was performed with patient supine.     Vagina and rectum normal.     No vaginal erythema or bleeding.     No right or left adnexal mass present.     Right adnexa not tender.     Left adnexa not tender.     Genitourinary Comments: Cervix and uterus absent. Vaginal cuff healing well w mild 1 mm areas of separation and brown d/c to touch w qtip  Cardiovascular:     Rate and Rhythm: Normal rate.  Pulmonary:     Effort: Pulmonary effort is normal.  Abdominal:     General: There is no distension.     Palpations: Abdomen is soft.     Tenderness: There is no abdominal tenderness.     Comments: Incision healing well.  Musculoskeletal:        General: Normal range of motion.  Neurological:     Mental Status: She is alert and oriented to person, place, and time.     Cranial Nerves: No cranial nerve deficit.  Skin:    General: Skin is warm and dry.     Assessment: s/p :  total laparoscopic hysterectomy with bilateral salpingectomy progressing well  Plan: Patient has done well after surgery with no apparent complications.  I have discussed the post-operative course to date, and the expected progress moving forward.  The patient understands what complications to be concerned about.  I will see the patient in routine follow up, or  sooner if needed.    Activity plan: No restriction. Pelvic rest for 2 more weeks to allow vaginal tissues full healing time; monitor for any further bleeding or future bleeding w intercourse  Cindy Martin 03/11/2020, 10:49 AM

## 2020-07-17 DIAGNOSIS — Z1152 Encounter for screening for COVID-19: Secondary | ICD-10-CM | POA: Diagnosis not present

## 2020-07-17 DIAGNOSIS — Z03818 Encounter for observation for suspected exposure to other biological agents ruled out: Secondary | ICD-10-CM | POA: Diagnosis not present

## 2020-08-28 ENCOUNTER — Other Ambulatory Visit (INDEPENDENT_AMBULATORY_CARE_PROVIDER_SITE_OTHER): Payer: Self-pay | Admitting: Vascular Surgery

## 2021-09-03 ENCOUNTER — Telehealth: Payer: Self-pay | Admitting: Internal Medicine

## 2021-09-03 NOTE — Telephone Encounter (Signed)
Received message from Dr Lucky Cowboy regarding this pt.  She needs an appt with me (new pt).  Contact information:   WENDY HOBACK [address] [place] [address] 01-Oct-1970 [phone]

## 2021-09-04 NOTE — Telephone Encounter (Signed)
New pt appt scheduled. Will pick up new pt paperwork today to complete and bring to appt.

## 2021-09-05 ENCOUNTER — Other Ambulatory Visit: Payer: Self-pay

## 2021-09-05 ENCOUNTER — Ambulatory Visit: Payer: No Typology Code available for payment source | Admitting: Internal Medicine

## 2021-09-05 ENCOUNTER — Encounter: Payer: Self-pay | Admitting: Internal Medicine

## 2021-09-05 DIAGNOSIS — I10 Essential (primary) hypertension: Secondary | ICD-10-CM

## 2021-09-05 DIAGNOSIS — E041 Nontoxic single thyroid nodule: Secondary | ICD-10-CM | POA: Diagnosis not present

## 2021-09-05 DIAGNOSIS — R519 Headache, unspecified: Secondary | ICD-10-CM | POA: Diagnosis not present

## 2021-09-05 DIAGNOSIS — R930 Abnormal findings on diagnostic imaging of skull and head, not elsewhere classified: Secondary | ICD-10-CM

## 2021-09-05 LAB — SEDIMENTATION RATE: Sed Rate: 13 mm/hr (ref 0–20)

## 2021-09-05 LAB — CBC WITH DIFFERENTIAL/PLATELET
Basophils Absolute: 0.1 10*3/uL (ref 0.0–0.1)
Basophils Relative: 0.8 % (ref 0.0–3.0)
Eosinophils Absolute: 0.4 10*3/uL (ref 0.0–0.7)
Eosinophils Relative: 5.4 % — ABNORMAL HIGH (ref 0.0–5.0)
HCT: 45.2 % (ref 36.0–46.0)
Hemoglobin: 14.9 g/dL (ref 12.0–15.0)
Lymphocytes Relative: 15.8 % (ref 12.0–46.0)
Lymphs Abs: 1.1 10*3/uL (ref 0.7–4.0)
MCHC: 32.9 g/dL (ref 30.0–36.0)
MCV: 92.3 fl (ref 78.0–100.0)
Monocytes Absolute: 0.4 10*3/uL (ref 0.1–1.0)
Monocytes Relative: 6.1 % (ref 3.0–12.0)
Neutro Abs: 4.9 10*3/uL (ref 1.4–7.7)
Neutrophils Relative %: 71.9 % (ref 43.0–77.0)
Platelets: 232 10*3/uL (ref 150.0–400.0)
RBC: 4.9 Mil/uL (ref 3.87–5.11)
RDW: 13.8 % (ref 11.5–15.5)
WBC: 6.8 10*3/uL (ref 4.0–10.5)

## 2021-09-05 MED ORDER — PREDNISONE 10 MG PO TABS: ORAL_TABLET | ORAL | 0 refills | Status: DC

## 2021-09-05 MED ORDER — VALSARTAN 80 MG PO TABS: 80.0000 mg | ORAL_TABLET | Freq: Every day | ORAL | 1 refills | Status: DC

## 2021-09-05 NOTE — Progress Notes (Signed)
Patient ID: Cindy Martin, female   DOB: 07-Jan-1971, 51 y.o.   MRN: [id number]   Subjective:    Patient ID: Cindy Martin, female    DOB: 04-Jun-1971, 51 y.o.   MRN: [id number]  This visit occurred during the SARS-CoV-2 public health emergency.  Safety protocols were in place, including screening questions prior to the visit, additional usage of staff PPE, and extensive cleaning of exam room while observing appropriate contact time as indicated for disinfecting solutions.   Patient here to establish care.  Chief Complaint  Patient presents with   Establish Care   .   HPI No regular PCP.  Previously saw Dr Kenton Kingfisher and is s/p laparoscopic hysterectomy 01/30/20.  Presented to ED 09/01/21 - with increased headache and elevated blood pressure.  States woke 08/28/21 with headache - bilateral posterior head.  Continued headache.  Stayed in bed over the weekend.  Moved to involve the left side - posterior head and left ear.  Increased neck discomfort.  CT head:  -- a 0.8 cm hypodense focus in the left frontal lobe. In the absence of acute neurologic deficits, this is favored to represent a remote infarct versus chronic white matter changes. No acute intracranial hemorrhage.Normal CTA head.  CTA neck: --No evidence of dissection or aneurysm in the carotid or vertebral arteries. Incidentally noted thyroid nodules measuring up to 1.1 cm. In the left upper thyroid lobe, there is a peripherally enhancing 0.9 cm thyroid nodule. Recommend nonemergent outpatient thyroid ultrasound evaluation. She was given muscle relaxer.  States since her discharge from ER, she has had persistent posterior head pain and neck pain.  Increased neck discomfort when rotates her head - specifically looking to the left.  Blood pressure has remained elevated.  No dizziness or light headedness.  No vision change.  No other neurological changes noted.  No chest pain or sob reported.  No abdominal pain or bowel change reported.  Has not  history of high blood pressure, heart disease, diabetes or elevated cholesterol.     Past Medical History:  Diagnosis Date   Chicken pox    Hypertension    Menorrhagia    Stroke [facility])    Past Surgical History:  Procedure Laterality Date   ABDOMINAL HYSTERECTOMY     CYSTOSCOPY N/A 01/30/2020   Procedure: CYSTOSCOPY;  Surgeon: Gae Dry, MD;  Location: [facility] ORS;  Service: Gynecology;  Laterality: N/A;   TOTAL LAPAROSCOPIC HYSTERECTOMY WITH SALPINGECTOMY Bilateral 01/30/2020   Procedure: TOTAL LAPAROSCOPIC HYSTERECTOMY WITH RIGHT SALPINGECTOMY;  Surgeon: Gae Dry, MD;  Location: [facility] ORS;  Service: Gynecology;  Laterality: Bilateral;   TUBAL LIGATION     Tube reversal     Family History  Problem Relation Age of Onset   Stroke Mother    Hypertension Mother    Arthritis Mother    Hypertension Sister    Asthma Daughter    Breast cancer Neg Hx    Ovarian cancer Neg Hx    Social History   Socioeconomic History   Marital status: Married    Spouse name: Not on file   Number of children: Not on file   Years of education: Not on file   Highest education level: Not on file  Occupational History   Occupation: Preschool Teacher  Tobacco Use   Smoking status: Never   Smokeless tobacco: Never  Vaping Use   Vaping Use: Never used  Substance and Sexual Activity   Alcohol use: No   Drug use: No  Sexual activity: Yes    Birth control/protection: Surgical    Comment: Tubal ligation/reversed  Other Topics Concern   Not on file  Social History Narrative   Not on file   Social Determinants of Health   Financial Resource Strain: Not on file  Food Insecurity: Not on file  Transportation Needs: Not on file  Physical Activity: Not on file  Stress: Not on file  Social Connections: Not on file     Review of Systems  Constitutional:  Negative for appetite change and fever.  HENT:  Negative for congestion and sinus pressure.   Respiratory:  Negative for cough,  chest tightness and shortness of breath.   Cardiovascular:  Negative for chest pain, palpitations and leg swelling.  Gastrointestinal:  Negative for abdominal pain, diarrhea, nausea and vomiting.  Genitourinary:  Negative for difficulty urinating and dysuria.  Musculoskeletal:  Positive for neck pain. Negative for joint swelling and myalgias.  Skin:  Negative for color change and rash.  Neurological:  Positive for headaches. Negative for dizziness and light-headedness.  Psychiatric/Behavioral:  Negative for agitation and dysphoric mood.       Objective:     BP (!) 160/100    Pulse 96    Temp 97.8 F (36.6 C)    Resp 16    Ht 5\' 9"  (1.753 m)    Wt 232 lb 3.2 oz (105.3 kg)    LMP 01/02/2020 (Exact Date)    SpO2 97%    BMI 34.29 kg/m  Wt Readings from Last 3 Encounters:  09/05/21 232 lb 3.2 oz (105.3 kg)  03/11/20 244 lb (110.7 kg)  02/12/20 (!) 243 lb (110.2 kg)    Physical Exam Vitals reviewed.  Constitutional:      General: She is not in acute distress.    Appearance: Normal appearance.  HENT:     Head: Normocephalic and atraumatic.     Right Ear: External ear normal.     Left Ear: External ear normal.  Eyes:     General: No scleral icterus.       Right eye: No discharge.        Left eye: No discharge.     Conjunctiva/sclera: Conjunctivae normal.  Neck:     Thyroid: No thyromegaly.  Cardiovascular:     Rate and Rhythm: Normal rate and regular rhythm.  Pulmonary:     Effort: No respiratory distress.     Breath sounds: Normal breath sounds. No wheezing.  Abdominal:     General: Bowel sounds are normal.     Palpations: Abdomen is soft.     Tenderness: There is no abdominal tenderness.  Musculoskeletal:        General: No swelling or tenderness.     Cervical back: Neck supple. No tenderness.     Comments: Increased pain with rotation of head - looking to left.   Lymphadenopathy:     Cervical: No cervical adenopathy.  Skin:    Findings: No erythema or rash.   Neurological:     Mental Status: She is alert.  Psychiatric:        Mood and Affect: Mood normal.        Behavior: Behavior normal.     Outpatient Encounter Medications as of 09/05/2021  Medication Sig   aspirin 81 MG EC tablet Take 1 tablet (81 mg total) by mouth daily. Swallow whole.   predniSONE (DELTASONE) 10 MG tablet Take 6 tablets x 1 day and then decrease by one tablet per day until  down to zero mg.   tiZANidine (ZANAFLEX) 4 MG tablet Take 4 mg by mouth 3 (three) times daily as needed.   valsartan (DIOVAN) 80 MG tablet Take 1 tablet (80 mg total) by mouth daily.   No facility-administered encounter medications on file as of 09/05/2021.     Lab Results  Component Value Date   WBC 6.8 09/05/2021   HGB 14.9 09/05/2021   HCT 45.2 09/05/2021   PLT 232.0 09/05/2021   GLUCOSE 100 (H) 12/12/2019   CHOL 172 12/12/2019   TRIG 101 12/12/2019   HDL 49 12/12/2019   LDLCALC 105 (H) 12/12/2019   ALT 20 12/12/2019   AST 19 12/12/2019   NA 142 12/12/2019   K 4.4 12/12/2019   CL 108 (H) 12/12/2019   CREATININE 0.98 12/12/2019   BUN 24 12/12/2019   CO2 20 12/12/2019   HGBA1C 5.6 12/12/2019       Assessment & Plan:   Problem List Items Addressed This Visit     Abnormal CT scan of head    CT head noted a 0.8 cm hypodense focus in the left frontal lobe. In the absence of acute neurologic deficits, this is favored to represent a remote infarct versus chronic white matter changes. No acute intracranial hemorrhage. CTA head - as outlined.  Question of infarct raised.  Given findings, will obtain MRI brain to better evaluate.  F/u with neurology as outlined.  Continue ECASA 81mg  q day.       Relevant Orders   MR Brain W Wo Contrast   Ambulatory referral to Neurology   Headache    Persistent increased headache- left posterior head pain and neck pain.  Just had CTA neck - no dissection - no acute abnormality.  Taking muscle relaxer.  Has percocet (from previous surgery).  Has been  taking prn.  Medrol dose pack.  Follow pressures.  Call with update.  Discussed with neurology.  They will schedule f/u appt        Relevant Medications   tiZANidine (ZANAFLEX) 4 MG tablet   aspirin 81 MG EC tablet   Other Relevant Orders   Sedimentation rate (Completed)   CBC with Differential/Platelet (Completed)   MR Brain W Wo Contrast   Ambulatory referral to Neurology   Hypertension, essential    Elevated blood pressure as outlined.  Persistent.  Treat the pain.  Recent kidney function wnl.  Start diovan 80mg  q day.  Return for metabolic panel in 88-41 days.  Follow pressures.        Relevant Medications   valsartan (DIOVAN) 80 MG tablet   aspirin 81 MG EC tablet   Other Relevant Orders   MR Brain W Wo Contrast   Ambulatory referral to Neurology   Basic metabolic panel   Thyroid nodule    Noted on recent scan.  Discussed endocrinology f/u.  Will obtain thyroid ultrasound.       Relevant Orders   US THYROID     Einar Pheasant, MD

## 2021-09-06 ENCOUNTER — Encounter: Payer: Self-pay | Admitting: Internal Medicine

## 2021-09-06 DIAGNOSIS — I10 Essential (primary) hypertension: Secondary | ICD-10-CM | POA: Insufficient documentation

## 2021-09-06 DIAGNOSIS — E041 Nontoxic single thyroid nodule: Secondary | ICD-10-CM | POA: Insufficient documentation

## 2021-09-06 DIAGNOSIS — R930 Abnormal findings on diagnostic imaging of skull and head, not elsewhere classified: Secondary | ICD-10-CM | POA: Insufficient documentation

## 2021-09-06 MED ORDER — ASPIRIN 81 MG PO TBEC: 81.0000 mg | DELAYED_RELEASE_TABLET | Freq: Every day | ORAL | 0 refills | Status: DC

## 2021-09-06 NOTE — Assessment & Plan Note (Signed)
Elevated blood pressure as outlined.  Persistent.  Treat the pain.  Recent kidney function wnl.  Start diovan 80mg  q day.  Return for metabolic panel in 00-29 days.  Follow pressures.

## 2021-09-06 NOTE — Assessment & Plan Note (Signed)
Noted on recent scan.  Discussed endocrinology f/u.  Will obtain thyroid ultrasound.

## 2021-09-06 NOTE — Assessment & Plan Note (Signed)
Persistent increased headache- left posterior head pain and neck pain.  Just had CTA neck - no dissection - no acute abnormality.  Taking muscle relaxer.  Has percocet (from previous surgery).  Has been taking prn.  Medrol dose pack.  Follow pressures.  Call with update.  Discussed with neurology.  They will schedule f/u appt

## 2021-09-06 NOTE — Assessment & Plan Note (Signed)
CT head noted a 0.8 cm hypodense focus in the left frontal lobe. In the absence of acute neurologic deficits, this is favored to represent a remote infarct versus chronic white matter changes. No acute intracranial hemorrhage. CTA head - as outlined.  Question of infarct raised.  Given findings, will obtain MRI brain to better evaluate.  F/u with neurology as outlined.  Continue ECASA 81mg  q day.

## 2021-09-12 ENCOUNTER — Encounter: Payer: Self-pay | Admitting: Internal Medicine

## 2021-09-12 NOTE — Telephone Encounter (Signed)
Patient was given tizanidine 4mg  at the ED. Dr Manuella Ghazi gave her baclofen but too strong for her to take. Ok to send in short supply of tizanidine until she sees Dr Nicki Reaper next week?

## 2021-09-14 MED ORDER — TIZANIDINE HCL 4 MG PO TABS: 4.0000 mg | ORAL_TABLET | Freq: Three times a day (TID) | ORAL | 0 refills | Status: DC | PRN

## 2021-09-15 ENCOUNTER — Other Ambulatory Visit (INDEPENDENT_AMBULATORY_CARE_PROVIDER_SITE_OTHER): Payer: No Typology Code available for payment source

## 2021-09-15 ENCOUNTER — Other Ambulatory Visit: Payer: Self-pay

## 2021-09-15 DIAGNOSIS — I10 Essential (primary) hypertension: Secondary | ICD-10-CM

## 2021-09-16 LAB — BASIC METABOLIC PANEL
BUN: 24 mg/dL — ABNORMAL HIGH (ref 6–23)
CO2: 24 mEq/L (ref 19–32)
Calcium: 8.8 mg/dL (ref 8.4–10.5)
Chloride: 107 mEq/L (ref 96–112)
Creatinine, Ser: 1.03 mg/dL (ref 0.40–1.20)
GFR: 64.38 mL/min (ref >60.00)
Glucose, Bld: 92 mg/dL (ref 70–99)
Potassium: 3.9 mEq/L (ref 3.5–5.1)
Sodium: 142 mEq/L (ref 135–145)

## 2021-09-19 ENCOUNTER — Other Ambulatory Visit: Payer: No Typology Code available for payment source

## 2021-09-19 ENCOUNTER — Ambulatory Visit
Admission: RE | Admit: 2021-09-19 | Discharge: 2021-09-19 | Disposition: A | Discharge status: Home / Self Care | Payer: No Typology Code available for payment source | Source: Ambulatory Visit | Attending: Internal Medicine | Admitting: Internal Medicine

## 2021-09-19 ENCOUNTER — Other Ambulatory Visit: Payer: Self-pay

## 2021-09-19 DIAGNOSIS — R519 Headache, unspecified: Secondary | ICD-10-CM | POA: Diagnosis not present

## 2021-09-19 DIAGNOSIS — E041 Nontoxic single thyroid nodule: Secondary | ICD-10-CM | POA: Insufficient documentation

## 2021-09-19 DIAGNOSIS — R930 Abnormal findings on diagnostic imaging of skull and head, not elsewhere classified: Secondary | ICD-10-CM | POA: Diagnosis present

## 2021-09-19 DIAGNOSIS — I10 Essential (primary) hypertension: Secondary | ICD-10-CM | POA: Insufficient documentation

## 2021-09-19 MED ORDER — GADOBUTROL 1 MMOL/ML IV SOLN
10.0000 mL | Freq: Once | INTRAVENOUS | Status: AC | PRN
  Administered 2021-09-19: 10 mL via INTRAVENOUS

## 2021-09-22 ENCOUNTER — Other Ambulatory Visit: Payer: No Typology Code available for payment source

## 2021-09-24 ENCOUNTER — Encounter: Payer: Self-pay | Admitting: Internal Medicine

## 2021-09-24 ENCOUNTER — Ambulatory Visit: Payer: No Typology Code available for payment source | Admitting: Internal Medicine

## 2021-09-24 ENCOUNTER — Other Ambulatory Visit: Payer: Self-pay

## 2021-09-24 VITALS — BP 154/94 | HR 86 | Temp 97.9°F | Resp 16 | Ht 69.0 in | Wt 235.0 lb

## 2021-09-24 DIAGNOSIS — R519 Headache, unspecified: Secondary | ICD-10-CM

## 2021-09-24 DIAGNOSIS — E041 Nontoxic single thyroid nodule: Secondary | ICD-10-CM

## 2021-09-24 DIAGNOSIS — Z13220 Encounter for screening for lipoid disorders: Secondary | ICD-10-CM | POA: Diagnosis not present

## 2021-09-24 DIAGNOSIS — I10 Essential (primary) hypertension: Secondary | ICD-10-CM

## 2021-09-24 DIAGNOSIS — F439 Reaction to severe stress, unspecified: Secondary | ICD-10-CM

## 2021-09-24 LAB — LIPID PANEL
Cholesterol: 201 mg/dL — ABNORMAL HIGH (ref 0–200)
HDL: 45.3 mg/dL (ref >39.00)
LDL Cholesterol: 121 mg/dL — ABNORMAL HIGH (ref 0–99)
NonHDL: 155.73
Total CHOL/HDL Ratio: 4
Triglycerides: 172 mg/dL — ABNORMAL HIGH (ref 0.0–149.0)
VLDL: 34.4 mg/dL (ref 0.0–40.0)

## 2021-09-24 LAB — COMPREHENSIVE METABOLIC PANEL
ALT: 22 U/L (ref 0–35)
AST: 18 U/L (ref 0–37)
Albumin: 4.3 g/dL (ref 3.5–5.2)
Alkaline Phosphatase: 64 U/L (ref 39–117)
BUN: 26 mg/dL — ABNORMAL HIGH (ref 6–23)
CO2: 25 mEq/L (ref 19–32)
Calcium: 8.9 mg/dL (ref 8.4–10.5)
Chloride: 106 mEq/L (ref 96–112)
Creatinine, Ser: 0.85 mg/dL (ref 0.40–1.20)
GFR: 81.05 mL/min (ref >60.00)
Glucose, Bld: 96 mg/dL (ref 70–99)
Potassium: 4.1 mEq/L (ref 3.5–5.1)
Sodium: 139 mEq/L (ref 135–145)
Total Bilirubin: 0.5 mg/dL (ref 0.2–1.2)
Total Protein: 6.7 g/dL (ref 6.0–8.3)

## 2021-09-24 LAB — T4, FREE: Free T4: 0.88 ng/dL (ref 0.60–1.60)

## 2021-09-24 LAB — TSH: TSH: 0.47 u[IU]/mL (ref 0.35–5.50)

## 2021-09-24 MED ORDER — VALSARTAN-HYDROCHLOROTHIAZIDE 80-12.5 MG PO TABS: 1.0000 | ORAL_TABLET | Freq: Every day | ORAL | 1 refills | Status: DC

## 2021-09-24 MED ORDER — TIZANIDINE HCL 4 MG PO TABS: 4.0000 mg | ORAL_TABLET | Freq: Two times a day (BID) | ORAL | 0 refills | Status: DC | PRN

## 2021-09-24 NOTE — Assessment & Plan Note (Addendum)
Started on diovan last visit.  Blood pressure remaining elevated. She has decreased sodium and caffeine intake.  Change diovan to diovan/hctz 80/12.5.  Follow pressures.  Get her back in soon to reassess.  ?

## 2021-09-24 NOTE — Progress Notes (Signed)
Patient ID: Cindy Martin, female   DOB: 16-May-1971, 51 y.o.   MRN: [id number]   Subjective:    Patient ID: Cindy Martin, female    DOB: Nov 17, 1970, 51 y.o.   MRN: [id number]  This visit occurred during the SARS-CoV-2 public health emergency.  Safety protocols were in place, including screening questions prior to the visit, additional usage of staff PPE, and extensive cleaning of exam room while observing appropriate contact time as indicated for disinfecting solutions.   Patient here for a scheduled follow up.   Chief Complaint  Patient presents with   Headache   Hypertension   .   HPI Recent new pt evaluation.  Evaluated - head and neck pain and elevated blood pressure.  Had CT angio of head and neck - no evidence of dissection or aneurysm in carotid or vertebral.  Question of remote infarct vs white matter changes - CT head (frontal lobe).  See last note for details.  Started on diovan.  Taking muscle relaxers for her neck.  Had f/u MRI - reviewed by neurology - revealed mild white matter microvascular ischemic and metabolic changes.  No acute stroke or other change noted.  Saw neurology - dx - left cervicogenic headache vs myofacial pain syndrome vs hypertensive headache.  Prescribed baclofen.  Unable to tolerate baclofen.  Does tolerate tizanidine.  Blood pressure remaining elevated.  States can tell when blood pressure is up - will notice a pain in left posterior head.  Overall head and neck pain - improved.  Last took MR - last week.  No chest pain or sob.  Handling work stress.  Did discuss working 12 hour shifts.  Will decrease her to 8 hour shifts until can get above issues sorted through.  Has had some cough at night.  No fever.  No sinus congestion or drainage.  No chest congestion.  Does not feel bad.  No acid reflux.    Past Medical History:  Diagnosis Date   Chicken pox    Hypertension    Menorrhagia    Stroke [facility])    Past Surgical History:  Procedure Laterality Date    ABDOMINAL HYSTERECTOMY     CYSTOSCOPY N/A 01/30/2020   Procedure: CYSTOSCOPY;  Surgeon: Gae Dry, MD;  Location: [facility] ORS;  Service: Gynecology;  Laterality: N/A;   TOTAL LAPAROSCOPIC HYSTERECTOMY WITH SALPINGECTOMY Bilateral 01/30/2020   Procedure: TOTAL LAPAROSCOPIC HYSTERECTOMY WITH RIGHT SALPINGECTOMY;  Surgeon: Gae Dry, MD;  Location: [facility] OR[facility];  Service: Gynecology;  Laterality: Bilateral;   TUBAL LIGATION     Tube reversal     Family History  Problem Relation Age of Onset   Stroke Mother    Hypertension Mother    Arthritis Mother    Hypertension Sister    Asthma Daughter    Breast cancer Neg Hx    Ovarian cancer Neg Hx    Social History   Socioeconomic History   Marital status: Married    Spouse name: Not on file   Number of children: Not on file   Years of education: Not on file   Highest education level: Not on file  Occupational History   Occupation: Preschool Teacher  Tobacco Use   Smoking status: Never   Smokeless tobacco: Never  Vaping Use   Vaping Use: Never used  Substance and Sexual Activity   Alcohol use: No   Drug use: No   Sexual activity: Yes    Birth control/protection: Surgical    Comment: Tubal ligation/reversed  Other Topics Concern   Not on file  Social History Narrative   Not on file   Social Determinants of Health   Financial Resource Strain: Not on file  Food Insecurity: Not on file  Transportation Needs: Not on file  Physical Activity: Not on file  Stress: Not on file  Social Connections: Not on file     Review of Systems  Constitutional:  Negative for appetite change and unexpected weight change.  HENT:  Negative for congestion and sinus pressure.   Respiratory:  Positive for cough. Negative for chest tightness and shortness of breath.   Cardiovascular:  Negative for chest pain, palpitations and leg swelling.  Gastrointestinal:  Negative for abdominal pain, diarrhea, nausea and vomiting.  Genitourinary:   Negative for difficulty urinating and dysuria.  Musculoskeletal:  Negative for joint swelling and myalgias.  Skin:  Negative for color change and rash.  Neurological:  Positive for headaches. Negative for dizziness and light-headedness.  Psychiatric/Behavioral:  Negative for agitation and dysphoric mood.       Objective:     BP (!) 154/94    Pulse 86    Temp 97.9 F (36.6 C)    Resp 16    Ht '5\' 9"'$  (1.753 m)    Wt 235 lb (106.6 kg)    LMP 01/02/2020 (Exact Date)    SpO2 98%    BMI 34.70 kg/m  Wt Readings from Last 3 Encounters:  09/24/21 235 lb (106.6 kg)  09/05/21 232 lb 3.2 oz (105.3 kg)  03/11/20 244 lb (110.7 kg)    Physical Exam Vitals reviewed.  Constitutional:      General: She is not in acute distress.    Appearance: Normal appearance.  HENT:     Head: Normocephalic and atraumatic.     Right Ear: External ear normal.     Left Ear: External ear normal.  Eyes:     General: No scleral icterus.       Right eye: No discharge.        Left eye: No discharge.     Conjunctiva/sclera: Conjunctivae normal.  Neck:     Thyroid: No thyromegaly.  Cardiovascular:     Rate and Rhythm: Normal rate and regular rhythm.  Pulmonary:     Effort: No respiratory distress.     Breath sounds: Normal breath sounds. No wheezing.  Abdominal:     General: Bowel sounds are normal.     Palpations: Abdomen is soft.     Tenderness: There is no abdominal tenderness.  Musculoskeletal:        General: No swelling or tenderness.     Cervical back: Neck supple. No tenderness.  Lymphadenopathy:     Cervical: No cervical adenopathy.  Skin:    Findings: No erythema or rash.  Neurological:     Mental Status: She is alert.  Psychiatric:        Mood and Affect: Mood normal.        Behavior: Behavior normal.     Outpatient Encounter Medications as of 09/24/2021  Medication Sig   aspirin 81 MG EC tablet Take 1 tablet (81 mg total) by mouth daily. Swallow whole.   valsartan-hydrochlorothiazide  (DIOVAN-HCT) 80-12.5 MG tablet Take 1 tablet by mouth daily.   [DISCONTINUED] valsartan (DIOVAN) 80 MG tablet Take 1 tablet (80 mg total) by mouth daily.   tiZANidine (ZANAFLEX) 4 MG tablet Take 1 tablet (4 mg total) by mouth 2 (two) times daily as needed.   [DISCONTINUED] predniSONE (DELTASONE) 10  MG tablet Take 6 tablets x 1 day and then decrease by one tablet per day until down to zero mg.   [DISCONTINUED] tiZANidine (ZANAFLEX) 4 MG tablet Take 1 tablet (4 mg total) by mouth 3 (three) times daily as needed. (Patient not taking: Reported on 09/23/2021)   No facility-administered encounter medications on file as of 09/24/2021.     Lab Results  Component Value Date   WBC 6.8 09/05/2021   HGB 14.9 09/05/2021   HCT 45.2 09/05/2021   PLT 232.0 09/05/2021   GLUCOSE 96 09/24/2021   CHOL 201 (H) 09/24/2021   TRIG 172.0 (H) 09/24/2021   HDL 45.30 09/24/2021   LDLCALC 121 (H) 09/24/2021   ALT 22 09/24/2021   AST 18 09/24/2021   NA 139 09/24/2021   K 4.1 09/24/2021   CL 106 09/24/2021   CREATININE 0.85 09/24/2021   BUN 26 (H) 09/24/2021   CO2 25 09/24/2021   TSH 0.47 09/24/2021   HGBA1C 5.6 12/12/2019    MR Brain W Wo Contrast  Result Date: 09/19/2021 CLINICAL DATA:  Provided history: Non-intractable headache, unspecified chronicity pattern, unspecified headache type. Hypertension, essential. Abnormal CT scan of head. Stroke, follow-up; headache, new or worsening. Additional history provided by scanning technologist: Patient reports left-sided headache and elevated blood pressure for 2-3 weeks. EXAM: MRI HEAD WITHOUT AND WITH CONTRAST TECHNIQUE: Multiplanar, multiecho pulse sequences of the brain and surrounding structures were obtained without and with intravenous contrast. CONTRAST:  81m GADAVIST GADOBUTROL 1 MMOL/ML IV SOLN COMPARISON:  Report from head CT 05/28/1996 (images unavailable). FINDINGS: Brain: Cerebral volume is normal. Multifocal T2 FLAIR hyperintense signal abnormality within  the cerebral white matter, mild but advanced for age. There is no acute infarct. No evidence of an intracranial mass. No chronic intracranial blood products. No extra-axial fluid collection. No midline shift. No pathologic intracranial enhancement identified. Vascular: Maintained flow voids within the proximal large arterial vessels. Skull and upper cervical spine: No focal suspicious marrow lesion. Sinuses/Orbits: Moderate mucosal thickening, and possible fluid, within the right frontoethmoidal recess and anterior right ethmoid air cells. Mild mucosal thickening, and possible fluid, within posterior left ethmoid air cells. Mild mucosal thickening within the left sphenoid sinus. IMPRESSION: No evidence of acute intracranial abnormality. Multifocal T2 FLAIR hyperintense signal abnormality within the cerebral white matter, mild but advanced for age. These signal changes are nonspecific and differential considerations include chronic small vessel ischemic disease, sequela of chronic migraine headaches, sequela of a prior infectious/inflammatory process or less likely (given the distribution) sequela of a demyelinating process (such as multiple sclerosis), among others. Otherwise unremarkable MRI appearance of the brain. Paranasal sinus disease, as described. Electronically Signed   By: KKellie SimmeringD.O.   On: 09/19/2021 15:46   [place]  Result Date: 09/20/2021 CLINICAL DATA:  Thyroid nodules identified on CT EXAM: THYROID ULTRASOUND TECHNIQUE: Ultrasound examination of the thyroid gland and adjacent soft tissues was performed. COMPARISON:  None. FINDINGS: Parenchymal Echotexture: Mildly heterogenous Isthmus: 0.3 cm Right lobe: 5.7 x 2.4 x 2.3 cm Left lobe: 5.7 x 2.3 x 2.0 cm _________________________________________________________ Estimated total number of nodules >/= 1 cm: 6 Number of spongiform nodules >/=  2 cm not described below (TR1): 0 Number of mixed cystic and solid nodules >/= 1.5 cm not described  below (TR2): 0 _________________________________________________________ Nodule # 1: Location: Right; superior Maximum size: 1.2 cm; Other 2 dimensions: 1.0 x 0.7 cm Composition: mixed cystic and solid (1) Echogenicity: isoechoic (1) Shape: not taller-than-wide (0) Margins: smooth (0) Echogenic  foci: none (0) ACR TI-RADS total points: 2. ACR TI-RADS risk category: TR2 (2 points). ACR TI-RADS recommendations: This nodule does NOT meet TI-RADS criteria for biopsy or dedicated follow-up. _________________________________________________________ Nodule # 2: Location: Right; mid Maximum size: 3.2 cm; Other 2 dimensions: 1.5 x 1.4 cm Composition: solid/almost completely solid (2) Echogenicity: isoechoic (1) Shape: not taller-than-wide (0) Margins: ill-defined (0) Echogenic foci: none (0) ACR TI-RADS total points: 3. ACR TI-RADS risk category: TR3 (3 points). ACR TI-RADS recommendations: **Given size (>/= 2.5 cm) and appearance, fine needle aspiration of this mildly suspicious nodule should be considered based on TI-RADS criteria. _________________________________________________________ Nodule # 3: Location: Right; inferior Maximum size: 1.2 cm; Other 2 dimensions: 1.2 x 1.2 cm Composition: solid/almost completely solid (2) Echogenicity: isoechoic (1) Shape: not taller-than-wide (0) Margins: ill-defined (0) Echogenic foci: none (0) ACR TI-RADS total points: 3. ACR TI-RADS risk category: TR3 (3 points). ACR TI-RADS recommendations: Given size (<1.4 cm) and appearance, this nodule does NOT meet TI-RADS criteria for biopsy or dedicated follow-up. _________________________________________________________ Nodule # 4: Location: Left; superior Maximum size: 0.8 cm; Other 2 dimensions: 0.7 x 0.7 cm Composition: solid/almost completely solid (2) Echogenicity: isoechoic (1) Shape: not taller-than-wide (0) Margins: ill-defined (0) Echogenic foci: none (0) ACR TI-RADS total points: 3. ACR TI-RADS risk category: TR3 (3 points). ACR  TI-RADS recommendations: Given size (<1.4 cm) and appearance, this nodule does NOT meet TI-RADS criteria for biopsy or dedicated follow-up. _________________________________________________________ Nodule # 5: Location: Left; mid Maximum size: 1.1 cm; Other 2 dimensions: 0.7 x 0.6 cm Composition: solid/almost completely solid (2) Echogenicity: hypoechoic (2) Shape: not taller-than-wide (0) Margins: smooth (0) Echogenic foci: none (0) ACR TI-RADS total points: 4. ACR TI-RADS risk category: TR4 (4-6 points). ACR TI-RADS recommendations: *Given size (>/= 1 - 1.4 cm) and appearance, a follow-up ultrasound in 1 year should be considered based on TI-RADS criteria. _________________________________________________________ Nodule # 6: Location: Left; mid Maximum size: 1.6 cm; Other 2 dimensions: 1.4 x 1.3 cm Composition: mixed cystic and solid (1) Echogenicity: isoechoic (1) Shape: not taller-than-wide (0) Margins: smooth (0) Echogenic foci: none (0) ACR TI-RADS total points: 2. ACR TI-RADS risk category: TR2 (2 points). ACR TI-RADS recommendations: This nodule does NOT meet TI-RADS criteria for biopsy or dedicated follow-up. _________________________________________________________ Nodule # 7: Location: Left; Inferior Maximum size: 1.7 cm; Other 2 dimensions: 1.7 x 1.5 cm Composition: solid/almost completely solid (2) Echogenicity: isoechoic (1) Shape: not taller-than-wide (0) Margins: ill-defined (0) Echogenic foci: none (0) ACR TI-RADS total points: 3. ACR TI-RADS risk category: TR3 (3 points). ACR TI-RADS recommendations: *Given size (>/= 1.5 - 2.4 cm) and appearance, a follow-up ultrasound in 1 year should be considered based on TI-RADS criteria. IMPRESSION: 1. Nodule 2 (TI-RADS 3), measuring 3.2 cm, located in the mid posterior right thyroid lobe meets criteria for FNA. Alternatively, this could represent a parathyroid mass. Please correlate with laboratory values and nuclear medicine parathyroid scan if clinically  appropriate. 2. Nodule 5 (TI-RADS 4), measuring 1.1 cm, located in the mid left thyroid lobe, meets criteria for imaging follow-up. Annual ultrasound surveillance is recommended until 5 years of stability is documented. 3. Nodule 7 (TI-RADS 3), measuring 1.7 cm, located in the inferior left thyroid lobe, meets criteria for imaging follow-up. Annual ultrasound surveillance is recommended until 5 years of stability is documented. The above is in keeping with the ACR TI-RADS recommendations - J Am Coll Radiol 2017;14:587-595. Electronically Signed   By: Miachel Roux M.D.   On: 09/20/2021 07:31  Assessment & Plan:   Problem List Items Addressed This Visit     Headache    MRI as outlined.  Saw neurology.  Request to have refill tizanidine to have if needed.  Tolerates.  Overall head pain is better.  Continue to adjust medication for better blood pressure control.  Continue f/u with neurology.       Relevant Medications   tiZANidine (ZANAFLEX) 4 MG tablet   Hypertension, essential    Started on diovan last visit.  Blood pressure remaining elevated. She has decreased sodium and caffeine intake.  Change diovan to diovan/hctz 80/12.5.  Follow pressures.  Get her back in soon to reassess.       Relevant Medications   valsartan-hydrochlorothiazide (DIOVAN-HCT) 80-12.5 MG tablet   Other Relevant Orders   Comprehensive metabolic panel (Completed)   Stress    She feels she is handling things well.  Note given for work - 8 hour shifts.       Thyroid nodule    Just had thyroid ultrasound 09/19/21:   Nodule 2 (TI-RADS 3), measuring 3.2 cm, located in the mid posterior right thyroid lobe meets criteria for FNA. Alternatively, this could represent a parathyroid mass. Please correlate with laboratory values and nuclear medicine parathyroid scan if clinically appropriate. 2. Nodule 5 (TI-RADS 4), measuring 1.1 cm, located in the mid left thyroid lobe, meets criteria for imaging follow-up.  Annual ultrasound surveillance is recommended until 5 years of stability is documented. 3. Nodule 7 (TI-RADS 3), measuring 1.7 cm, located in the inferior left thyroid lobe, meets criteria for imaging follow-up. Annual ultrasound surveillance is recommended until 5 years of stability is Documented. Has appt in 12/2021 with Dr Gabriel Carina. Check tsh and free T4 today.       Relevant Orders   T4, free (Completed)   TSH (Completed)   Other Visit Diagnoses     Screening cholesterol level    -  Primary   Relevant Orders   Lipid panel (Completed)        Einar Pheasant, MD

## 2021-09-24 NOTE — Assessment & Plan Note (Addendum)
Just had thyroid ultrasound 09/19/21:   ?Nodule 2 (TI-RADS 3), measuring 3.2 cm, located in the mid ?posterior right thyroid lobe meets criteria for FNA. Alternatively, ?this could represent a parathyroid mass. Please correlate with ?laboratory values and nuclear medicine parathyroid scan if ?clinically appropriate. ?2. Nodule 5 (TI-RADS 4), measuring 1.1 cm, located in the mid left ?thyroid lobe, meets criteria for imaging follow-up. Annual ?ultrasound surveillance is recommended until 5 years of stability is ?documented. ?3. Nodule 7 (TI-RADS 3), measuring 1.7 cm, located in the inferior ?left thyroid lobe, meets criteria for imaging follow-up. Annual ?ultrasound surveillance is recommended until 5 years of stability is ?Documented. ?Has appt in 12/2021 with Dr Gabriel Carina. Check tsh and free T4 today.  ?

## 2021-09-25 DIAGNOSIS — F439 Reaction to severe stress, unspecified: Secondary | ICD-10-CM | POA: Insufficient documentation

## 2021-09-25 NOTE — Assessment & Plan Note (Signed)
MRI as outlined.  Saw neurology.  Request to have refill tizanidine to have if needed.  Tolerates.  Overall head pain is better.  Continue to adjust medication for better blood pressure control.  Continue f/u with neurology.  ?

## 2021-09-25 NOTE — Assessment & Plan Note (Signed)
She feels she is handling things well.  Note given for work - 8 hour shifts.  ?

## 2021-09-26 ENCOUNTER — Telehealth: Payer: Self-pay

## 2021-09-26 NOTE — Telephone Encounter (Signed)
LMTCB

## 2021-09-26 NOTE — Telephone Encounter (Signed)
-----  Message from Einar Pheasant, MD sent at 09/25/2021  4:42 AM EST ----- ?Notify Lakeysha that her cholesterol is a little elevated.  I recommend a low carb diet and exercise.  Send copy of [facility] Lipid diet.  Thyroid tests, kidney function and liver function tests are wnl.  I started her on diovan/hctz.  Have her come in for f/u met b in 2-3 weeks.  ?

## 2021-09-27 ENCOUNTER — Other Ambulatory Visit: Payer: Self-pay | Admitting: Internal Medicine

## 2021-10-15 ENCOUNTER — Other Ambulatory Visit: Payer: Self-pay

## 2021-10-15 ENCOUNTER — Ambulatory Visit: Payer: No Typology Code available for payment source | Admitting: Internal Medicine

## 2021-10-15 ENCOUNTER — Telehealth: Payer: Self-pay

## 2021-10-15 ENCOUNTER — Encounter: Payer: Self-pay | Admitting: Internal Medicine

## 2021-10-15 VITALS — BP 124/76 | HR 89 | Temp 98.0°F | Resp 16 | Ht 69.0 in | Wt 233.0 lb

## 2021-10-15 DIAGNOSIS — E041 Nontoxic single thyroid nodule: Secondary | ICD-10-CM

## 2021-10-15 DIAGNOSIS — R519 Headache, unspecified: Secondary | ICD-10-CM

## 2021-10-15 DIAGNOSIS — F439 Reaction to severe stress, unspecified: Secondary | ICD-10-CM

## 2021-10-15 DIAGNOSIS — Z1211 Encounter for screening for malignant neoplasm of colon: Secondary | ICD-10-CM

## 2021-10-15 DIAGNOSIS — I10 Essential (primary) hypertension: Secondary | ICD-10-CM

## 2021-10-15 LAB — BASIC METABOLIC PANEL
BUN: 25 mg/dL — ABNORMAL HIGH (ref 6–23)
CO2: 25 mEq/L (ref 19–32)
Calcium: 9.3 mg/dL (ref 8.4–10.5)
Chloride: 105 mEq/L (ref 96–112)
Creatinine, Ser: 0.92 mg/dL (ref 0.40–1.20)
GFR: 73.68 mL/min (ref >60.00)
Glucose, Bld: 100 mg/dL — ABNORMAL HIGH (ref 70–99)
Potassium: 3.8 mEq/L (ref 3.5–5.1)
Sodium: 139 mEq/L (ref 135–145)

## 2021-10-15 NOTE — Assessment & Plan Note (Signed)
Doing better with adjusted work schedule.  Follow.  ?

## 2021-10-15 NOTE — Assessment & Plan Note (Signed)
Resolved.  Not requiring muscle relaxer.  ?

## 2021-10-15 NOTE — Progress Notes (Signed)
Patient ID: Cindy Martin, female   DOB: 07/29/1970, 51 y.o.   MRN: [id number] ? ? ?Subjective:  ? ? Patient ID: Cindy Martin, female    DOB: [birth year], 51 y.o.   MRN: [id number] ? ?This visit occurred during the SARS-CoV-2 public health emergency.  Safety protocols were in place, including screening questions prior to the visit, additional usage of staff PPE, and extensive cleaning of exam room while observing appropriate contact time as indicated for disinfecting solutions.  ? ?Patient here for a scheduled follow up.  ? ?Chief Complaint  ?Patient presents with  ? Headache  ? Hypertension  ? .  ? ?HPI ?Follow up regarding her blood pressure.  Was placed on diovan/hctz last visit.  Her blood pressure is doing much better.  Outside checks averaging 118-122/60-70s.  No headache.  No neck pain.  No dizziness or light headedness.  No chest pain or sob reported.  No abdominal pain or bowel change reported.  Discussed screening colonoscopy.  Has not had.  ? ? ?Past Medical History:  ?Diagnosis Date  ? Chicken pox   ? Hypertension   ? Menorrhagia   ? Stroke [facility])   ? ?Past Surgical History:  ?Procedure Laterality Date  ? ABDOMINAL HYSTERECTOMY    ? CYSTOSCOPY N/A 01/30/2020  ? Procedure: CYSTOSCOPY;  Surgeon: Gae Dry, MD;  Location: [facility] ORS;  Service: Gynecology;  Laterality: N/A;  ? TOTAL LAPAROSCOPIC HYSTERECTOMY WITH SALPINGECTOMY Bilateral 01/30/2020  ? Procedure: TOTAL LAPAROSCOPIC HYSTERECTOMY WITH RIGHT SALPINGECTOMY;  Surgeon: Gae Dry, MD;  Location: [facility] ORS;  Service: Gynecology;  Laterality: Bilateral;  ? TUBAL LIGATION    ? Tube reversal    ? ?Family History  ?Problem Relation Age of Onset  ? Stroke Mother   ? Hypertension Mother   ? Arthritis Mother   ? Hypertension Sister   ? Asthma Daughter   ? Breast cancer Neg Hx   ? Ovarian cancer Neg Hx   ? ?Social History  ? ?Socioeconomic History  ? Marital status: Married  ?  Spouse name: Not on file  ? Number of children: Not on file  ? Years  of education: Not on file  ? Highest education level: Not on file  ?Occupational History  ? Occupation: Print production planner  ?Tobacco Use  ? Smoking status: Never  ? Smokeless tobacco: Never  ?Vaping Use  ? Vaping Use: Never used  ?Substance and Sexual Activity  ? Alcohol use: No  ? Drug use: No  ? Sexual activity: Yes  ?  Birth control/protection: Surgical  ?  Comment: Tubal ligation/reversed  ?Other Topics Concern  ? Not on file  ?Social History Narrative  ? Not on file  ? ?Social Determinants of Health  ? ?Financial Resource Strain: Not on file  ?Food Insecurity: Not on file  ?Transportation Needs: Not on file  ?Physical Activity: Not on file  ?Stress: Not on file  ?Social Connections: Not on file  ? ? ? ?Review of Systems  ?Constitutional:  Negative for appetite change and unexpected weight change.  ?HENT:  Negative for congestion and sinus pressure.   ?Respiratory:  Negative for cough, chest tightness and shortness of breath.   ?Cardiovascular:  Negative for chest pain, palpitations and leg swelling.  ?Gastrointestinal:  Negative for abdominal pain, diarrhea, nausea and vomiting.  ?Genitourinary:  Negative for difficulty urinating and dysuria.  ?Musculoskeletal:  Negative for joint swelling and myalgias.  ?Skin:  Negative for color change and rash.  ?Neurological:  Negative for  dizziness, light-headedness and headaches.  ?Psychiatric/Behavioral:  Negative for agitation and dysphoric mood.   ? ?   ?Objective:  ?  ? ?BP 124/76   Pulse 89   Temp 98 ?F (36.7 ?C)   Resp 16   Ht '5\' 9"'$  (1.753 m)   Wt 233 lb (105.7 kg)   LMP 01/02/2020 (Exact Date)   SpO2 98%   BMI 34.41 kg/m?  ?Wt Readings from Last 3 Encounters:  ?10/15/21 233 lb (105.7 kg)  ?09/24/21 235 lb (106.6 kg)  ?09/05/21 232 lb 3.2 oz (105.3 kg)  ? ? ?Physical Exam ?Vitals reviewed.  ?Constitutional:   ?   General: She is not in acute distress. ?   Appearance: Normal appearance.  ?HENT:  ?   Head: Normocephalic and atraumatic.  ?   Right Ear: External  ear normal.  ?   Left Ear: External ear normal.  ?Eyes:  ?   General: No scleral icterus.    ?   Right eye: No discharge.     ?   Left eye: No discharge.  ?   Conjunctiva/sclera: Conjunctivae normal.  ?Neck:  ?   Thyroid: No thyromegaly.  ?Cardiovascular:  ?   Rate and Rhythm: Normal rate and regular rhythm.  ?Pulmonary:  ?   Effort: No respiratory distress.  ?   Breath sounds: Normal breath sounds. No wheezing.  ?Abdominal:  ?   General: Bowel sounds are normal.  ?   Palpations: Abdomen is soft.  ?   Tenderness: There is no abdominal tenderness.  ?Musculoskeletal:     ?   General: No swelling or tenderness.  ?   Cervical back: Neck supple. No tenderness.  ?Lymphadenopathy:  ?   Cervical: No cervical adenopathy.  ?Skin: ?   Findings: No erythema or rash.  ?Neurological:  ?   Mental Status: She is alert.  ?Psychiatric:     ?   Mood and Affect: Mood normal.     ?   Behavior: Behavior normal.  ? ? ? ?Outpatient Encounter Medications as of 10/15/2021  ?Medication Sig  ? aspirin 81 MG EC tablet Take 1 tablet (81 mg total) by mouth daily. Swallow whole.  ? tiZANidine (ZANAFLEX) 4 MG tablet Take 1 tablet (4 mg total) by mouth 2 (two) times daily as needed.  ? valsartan-hydrochlorothiazide (DIOVAN-HCT) 80-12.5 MG tablet Take 1 tablet by mouth daily.  ? [DISCONTINUED] valsartan (DIOVAN) 80 MG tablet TAKE 1 TABLET BY MOUTH EVERY DAY  ? ?No facility-administered encounter medications on file as of 10/15/2021.  ?  ? ?Lab Results  ?Component Value Date  ? WBC 6.8 09/05/2021  ? HGB 14.9 09/05/2021  ? HCT 45.2 09/05/2021  ? PLT 232.0 09/05/2021  ? GLUCOSE 96 09/24/2021  ? CHOL 201 (H) 09/24/2021  ? TRIG 172.0 (H) 09/24/2021  ? HDL 45.30 09/24/2021  ? LDLCALC 121 (H) 09/24/2021  ? ALT 22 09/24/2021  ? AST 18 09/24/2021  ? NA 139 09/24/2021  ? K 4.1 09/24/2021  ? CL 106 09/24/2021  ? CREATININE 0.85 09/24/2021  ? BUN 26 (H) 09/24/2021  ? CO2 25 09/24/2021  ? TSH 0.47 09/24/2021  ? HGBA1C 5.6 12/12/2019  ? ? ?MR Brain W Wo  Contrast ? ?Result Date: 09/19/2021 ?CLINICAL DATA:  Provided history: Non-intractable headache, unspecified chronicity pattern, unspecified headache type. Hypertension, essential. Abnormal CT scan of head. Stroke, follow-up; headache, new or worsening. Additional history provided by scanning technologist: Patient reports left-sided headache and elevated blood pressure for 2-3 weeks. EXAM:  MRI HEAD WITHOUT AND WITH CONTRAST TECHNIQUE: Multiplanar, multiecho pulse sequences of the brain and surrounding structures were obtained without and with intravenous contrast. CONTRAST:  67m GADAVIST GADOBUTROL 1 MMOL/ML IV SOLN COMPARISON:  Report from head CT 05/28/1996 (images unavailable). FINDINGS: Brain: Cerebral volume is normal. Multifocal T2 FLAIR hyperintense signal abnormality within the cerebral white matter, mild but advanced for age. There is no acute infarct. No evidence of an intracranial mass. No chronic intracranial blood products. No extra-axial fluid collection. No midline shift. No pathologic intracranial enhancement identified. Vascular: Maintained flow voids within the proximal large arterial vessels. Skull and upper cervical spine: No focal suspicious marrow lesion. Sinuses/Orbits: Moderate mucosal thickening, and possible fluid, within the right frontoethmoidal recess and anterior right ethmoid air cells. Mild mucosal thickening, and possible fluid, within posterior left ethmoid air cells. Mild mucosal thickening within the left sphenoid sinus. IMPRESSION: No evidence of acute intracranial abnormality. Multifocal T2 FLAIR hyperintense signal abnormality within the cerebral white matter, mild but advanced for age. These signal changes are nonspecific and differential considerations include chronic small vessel ischemic disease, sequela of chronic migraine headaches, sequela of a prior infectious/inflammatory process or less likely (given the distribution) sequela of a demyelinating process (such as  multiple sclerosis), among others. Otherwise unremarkable MRI appearance of the brain. Paranasal sinus disease, as described. Electronically Signed   By: KKellie SimmeringD.O.   On: 09/19/2021 15:46  ? ?[place] ? ?Result Date:

## 2021-10-15 NOTE — Assessment & Plan Note (Signed)
Blood pressure doing well on diovan/hctz.  Feels better.  Check metabolic panel today.  ?

## 2021-10-15 NOTE — Assessment & Plan Note (Signed)
Agreeable for referral for colonoscopy.  Refer to GI for screening colonoscopy.  ?

## 2021-10-15 NOTE — Telephone Encounter (Signed)
CALLED PATIENT NO ANSWER LEFT VOICEMAIL FOR A CALL BACK ? ?

## 2021-10-15 NOTE — Assessment & Plan Note (Addendum)
Just had thyroid ultrasound 09/19/21:   ?Nodule 2 (TI-RADS 3), measuring 3.2 cm, located in the mid ?posterior right thyroid lobe meets criteria for FNA. Alternatively, ?this could represent a parathyroid mass. Please correlate with ?laboratory values and nuclear medicine parathyroid scan if ?clinically appropriate. ?2. Nodule 5 (TI-RADS 4), measuring 1.1 cm, located in the mid left ?thyroid lobe, meets criteria for imaging follow-up. Annual ?ultrasound surveillance is recommended until 5 years of stability is ?documented. ?3. Nodule 7 (TI-RADS 3), measuring 1.7 cm, located in the inferior ?left thyroid lobe, meets criteria for imaging follow-up. Annual ?ultrasound surveillance is recommended until 5 years of stability is ?Documented. ?Has appt in 12/2021 with Dr Gabriel Carina.  ?

## 2021-10-16 ENCOUNTER — Other Ambulatory Visit: Payer: Self-pay | Admitting: Internal Medicine

## 2021-10-16 ENCOUNTER — Telehealth: Payer: Self-pay

## 2021-10-16 NOTE — Telephone Encounter (Signed)
CALLED PATIENT NO ANSWER LEFT VOICEMAIL FOR A CALL BACK LETTER SENT 

## 2021-10-17 ENCOUNTER — Other Ambulatory Visit: Payer: Self-pay

## 2021-10-17 DIAGNOSIS — Z1211 Encounter for screening for malignant neoplasm of colon: Secondary | ICD-10-CM

## 2021-10-17 MED ORDER — NA SULFATE-K SULFATE-MG SULF 17.5-3.13-1.6 GM/177ML PO SOLN: 1.0000 | Freq: Once | ORAL | 0 refills | Status: AC

## 2021-10-17 NOTE — Progress Notes (Signed)
Gastroenterology Pre-Procedure Review ? ?Request Date: 10/24/2021 ?Requesting Physician: Dr. Vicente Males ? ?PATIENT REVIEW QUESTIONS: The patient responded to the following health history questions as indicated:   ? ?1. Are you having any GI issues? no ?2. Do you have a personal history of Polyps? no ?3. Do you have a family history of Colon Cancer or Polyps? no ?4. Diabetes Mellitus? no ?5. Joint replacements in the past 12 months?no ?6. Major health problems in the past 3 months?no ?7. Any artificial heart valves, MVP, or defibrillator?no ?   ?MEDICATIONS & ALLERGIES:    ?Patient reports the following regarding taking any anticoagulation/antiplatelet therapy:   ?Plavix, Coumadin, Eliquis, Xarelto, Lovenox, Pradaxa, Brilinta, or Effient? no ?Aspirin? yes (81 mg) ? ?Patient confirms/reports the following medications:  ?Current Outpatient Medications  ?Medication Sig Dispense Refill  ? aspirin 81 MG EC tablet Take 1 tablet (81 mg total) by mouth daily. Swallow whole. 30 tablet 0  ? tiZANidine (ZANAFLEX) 4 MG tablet Take 1 tablet (4 mg total) by mouth 2 (two) times daily as needed. 20 tablet 0  ? valsartan-hydrochlorothiazide (DIOVAN-HCT) 80-12.5 MG tablet Take 1 tablet by mouth daily. 30 tablet 1  ? ?No current facility-administered medications for this visit.  ? ? ?Patient confirms/reports the following allergies:  ?No Known Allergies ? ?No orders of the defined types were placed in this encounter. ? ? ?AUTHORIZATION INFORMATION ?Primary Insurance: ?1D#: ?Group #: ? ?Secondary Insurance: ?1D#: ?Group #: ? ?SCHEDULE INFORMATION: ?Date: 10/24/2021 ?Time: ?[place] ? ?

## 2021-10-23 ENCOUNTER — Encounter: Payer: Self-pay | Admitting: Gastroenterology

## 2021-10-24 ENCOUNTER — Other Ambulatory Visit: Payer: Self-pay

## 2021-10-24 ENCOUNTER — Encounter: Payer: Self-pay | Admitting: Gastroenterology

## 2021-10-24 ENCOUNTER — Ambulatory Visit: Payer: No Typology Code available for payment source | Admitting: Anesthesiology

## 2021-10-24 ENCOUNTER — Encounter
Admission: RE | Disposition: A | Discharge status: Home / Self Care | Payer: Self-pay | Source: Home / Self Care | Attending: Gastroenterology

## 2021-10-24 ENCOUNTER — Ambulatory Visit
Admission: RE | Admit: 2021-10-24 | Discharge: 2021-10-24 | Disposition: A | Discharge status: Home / Self Care | Payer: No Typology Code available for payment source | Attending: Gastroenterology | Admitting: Gastroenterology

## 2021-10-24 DIAGNOSIS — I10 Essential (primary) hypertension: Secondary | ICD-10-CM | POA: Insufficient documentation

## 2021-10-24 DIAGNOSIS — D122 Benign neoplasm of ascending colon: Secondary | ICD-10-CM | POA: Diagnosis not present

## 2021-10-24 DIAGNOSIS — K635 Polyp of colon: Secondary | ICD-10-CM | POA: Insufficient documentation

## 2021-10-24 DIAGNOSIS — D128 Benign neoplasm of rectum: Secondary | ICD-10-CM | POA: Insufficient documentation

## 2021-10-24 DIAGNOSIS — Z1211 Encounter for screening for malignant neoplasm of colon: Secondary | ICD-10-CM | POA: Diagnosis not present

## 2021-10-24 HISTORY — PX: COLONOSCOPY WITH PROPOFOL: SHX5780

## 2021-10-24 SURGERY — COLONOSCOPY WITH PROPOFOL
Anesthesia: General

## 2021-10-24 MED ORDER — LIDOCAINE HCL (CARDIAC) PF 100 MG/5ML IV SOSY
PREFILLED_SYRINGE | INTRAVENOUS | Status: DC | PRN
Start: 2021-10-24 — End: 2021-10-24
  Administered 2021-10-24: 50 mg via INTRAVENOUS

## 2021-10-24 MED ORDER — LIDOCAINE HCL (PF) 2 % IJ SOLN
INTRAMUSCULAR | Status: AC
  Filled 2021-10-24: qty 5

## 2021-10-24 MED ORDER — PROPOFOL 10 MG/ML IV BOLUS
INTRAVENOUS | Status: DC | PRN
  Administered 2021-10-24: 80 mg via INTRAVENOUS

## 2021-10-24 MED ORDER — STERILE WATER FOR IRRIGATION IR SOLN
Status: DC | PRN
  Administered 2021-10-24: 100 mL

## 2021-10-24 MED ORDER — PROPOFOL 500 MG/50ML IV EMUL
INTRAVENOUS | Status: AC
  Filled 2021-10-24: qty 50

## 2021-10-24 MED ORDER — SODIUM CHLORIDE 0.9 % IV SOLN: INTRAVENOUS | Status: DC

## 2021-10-24 MED ORDER — PROPOFOL 500 MG/50ML IV EMUL
INTRAVENOUS | Status: DC | PRN
  Administered 2021-10-24: 150 ug/kg/min via INTRAVENOUS

## 2021-10-24 NOTE — Op Note (Signed)
[facility] ?Gastroenterology ?Patient Name: Cindy Martin ?Procedure Date: 10/24/2021 10:38 AM ?MRN: [id number] ?Account #: [id number] ?Date of Birth: [birth year] ?Admit Type: Outpatient ?Age: 51 ?Room: [facility] ENDO ROOM 3 ?Gender: Female ?Note Status: Finalized ?Instrument Name: Colonoscope [id number] ?Procedure:             Colonoscopy ?Indications:           Screening for colorectal malignant neoplasm ?Providers:             Jonathon Bellows MD, MD ?Referring MD:          Elon Alas. Nicki Reaper, MD (Referring MD) ?Medicines:             Monitored Anesthesia Care ?Complications:         No immediate complications. ?Procedure:             Pre-Anesthesia Assessment: ?                       - Prior to the procedure, a History and Physical was  ?                       performed, and patient medications, allergies and  ?                       sensitivities were reviewed. The patient's tolerance  ?                       of previous anesthesia was reviewed. ?                       - The risks and benefits of the procedure and the  ?                       sedation options and risks were discussed with the  ?                       patient. All questions were answered and informed  ?                       consent was obtained. ?                       - ASA Grade Assessment: II - A patient with mild  ?                       systemic disease. ?                       After obtaining informed consent, the colonoscope was  ?                       passed under direct vision. Throughout the procedure,  ?                       the patient's blood pressure, pulse, and oxygen  ?                       saturations were monitored continuously. The  ?                       Colonoscope was  introduced through the anus and  ?                       advanced to the the cecum, identified by the  ?                       appendiceal orifice. The colonoscopy was performed  ?                       with ease. The patient tolerated the procedure well.  ?                        The quality of the bowel preparation was excellent. ?Findings: ?     The perianal and digital rectal examinations were normal. ?     Three sessile polyps were found in the descending colon. The polyps were  ?     4 to 5 mm in size. These polyps were removed with a cold snare.  ?     Resection and retrieval were complete. ?     A 4 mm polyp was found in the rectum. The polyp was sessile. The polyp  ?     was removed with a cold snare. Resection and retrieval were complete. ?     A 3 mm polyp was found in the ascending colon. The polyp was sessile.  ?     The polyp was removed with a cold biopsy forceps. Resection and  ?     retrieval were complete. ?     The exam was otherwise without abnormality on direct and retroflexion  ?     views. ?Impression:            - Three 4 to 5 mm polyps in the descending colon,  ?                       removed with a cold snare. Resected and retrieved. ?                       - One 4 mm polyp in the rectum, removed with a cold  ?                       snare. Resected and retrieved. ?                       - One 3 mm polyp in the ascending colon, removed with  ?                       a cold biopsy forceps. Resected and retrieved. ?                       - The examination was otherwise normal on direct and  ?                       retroflexion views. ?Recommendation:        - Discharge patient to home (with escort). ?                       - Resume previous diet. ?                       -  Continue present medications. ?                       - Await pathology results. ?                       - Repeat colonoscopy for surveillance based on  ?                       pathology results. ?Procedure Code(s):     --- Professional --- ?                       [phone], Colonoscopy, flexible; with removal of  ?                       tumor(s), polyp(s), or other lesion(s) by snare  ?                       technique ?                       45380, 59, Colonoscopy, flexible; with  biopsy, single  ?                       or multiple ?Diagnosis Code(s):     --- Professional --- ?                       K63.5, Polyp of colon ?                       Z12.11, Encounter for screening for malignant neoplasm  ?                       of colon ?                       K62.1, Rectal polyp ?CPT copyright 2019 American Medical Association. All rights reserved. ?The codes documented in this report are preliminary and upon coder review may  ?be revised to meet current compliance requirements. ?Jonathon Bellows, MD ?Jonathon Bellows MD, MD ?10/24/2021 11:01:19 AM ?This report has been signed electronically. ?Number of Addenda: 0 ?Note Initiated On: 10/24/2021 10:38 AM ?Scope Withdrawal Time: 0 hours 13 minutes 11 seconds  ?Total Procedure Duration: 0 hours 15 minutes 51 seconds  ?Estimated Blood Loss:  Estimated blood loss: none. ?     [facility] ?

## 2021-10-24 NOTE — Anesthesia Procedure Notes (Signed)
Date/Time: 10/24/2021 10:39 AM ?Performed by: Johnna Acosta, CRNA ?Pre-anesthesia Checklist: Patient identified, Emergency Drugs available, Suction available, Patient being monitored and Timeout performed ?Patient Re-evaluated:Patient Re-evaluated prior to induction ?Oxygen Delivery Method: Nasal cannula ?Preoxygenation: Pre-oxygenation with 100% oxygen ?Induction Type: IV induction ? ? ? ? ?

## 2021-10-24 NOTE — Transfer of Care (Signed)
Immediate Anesthesia Transfer of Care Note ? ?Patient: Cindy Martin ? ?Procedure(s) Performed: COLONOSCOPY WITH PROPOFOL ? ?Patient Location: PACU ? ?Anesthesia Type:General ? ?Level of Consciousness: drowsy ? ?Airway & Oxygen Therapy: Patient Spontanous Breathing ? ?Post-op Assessment: Report given to RN and Post -op Vital signs reviewed and stable ? ?Post vital signs: Reviewed and stable ? ?Last Vitals:  ?Vitals Value Taken Time  ?BP 104/65 10/24/21 1104  ?Temp    ?Pulse 84 10/24/21 1104  ?Resp 13 10/24/21 1104  ?SpO2 99 % 10/24/21 1104  ? ? ?Last Pain:  ?Vitals:  ? 10/24/21 0926  ?TempSrc: Temporal  ?PainSc: 0-No pain  ?   ? ?  ? ?Complications: No notable events documented. ?

## 2021-10-24 NOTE — Anesthesia Preprocedure Evaluation (Signed)
Anesthesia Evaluation  ?Patient identified by MRN, date of birth, ID band ?Patient awake ? ? ? ?Reviewed: ?Allergy & Precautions, NPO status , Patient's Chart, lab work & pertinent test results ? ?History of Anesthesia Complications ?Negative for: history of anesthetic complications ? ?Airway ?Mallampati: I ? ?TM Distance: >3 FB ?Neck ROM: Full ? ? ? Dental ?no notable dental hx. ?(+) Teeth Intact ?  ?Pulmonary ?neg pulmonary ROS, neg sleep apnea, neg COPD, Patient abstained from smoking.Not current smoker,  ?  ?Pulmonary exam normal ?breath sounds clear to auscultation ? ? ? ? ? ? Cardiovascular ?Exercise Tolerance: Good ?METShypertension, Pt. on medications ?(-) CAD and (-) Past MI (-) dysrhythmias  ?Rhythm:Regular Rate:Normal ?- Systolic murmurs ? ?  ?Neuro/Psych ? Headaches, negative psych ROS  ? GI/Hepatic ?neg GERD  ,(+)  ?  ? (-) substance abuse ? ,   ?Endo/Other  ?neg diabetes ? Renal/GU ?negative Renal ROS  ? ?  ?Musculoskeletal ? ? Abdominal ?  ?Peds ? Hematology ?  ?Anesthesia Other Findings ?Past Medical History: ?No date: Chicken pox ?No date: Hypertension ?No date: Menorrhagia ? Reproductive/Obstetrics ? ?  ? ? ? ? ? ? ? ? ? ? ? ? ? ?  ?  ? ? ? ? ? ? ? ? ?Anesthesia Physical ?Anesthesia Plan ? ?ASA: 2 ? ?Anesthesia Plan: General  ? ?Post-op Pain Management: Minimal or no pain anticipated  ? ?Induction: Intravenous ? ?PONV Risk Score and Plan: 3 and Propofol infusion, TIVA and Ondansetron ? ?Airway Management Planned: Nasal Cannula ? ?Additional Equipment: None ? ?Intra-op Plan:  ? ?Post-operative Plan:  ? ?Informed Consent: I have reviewed the patients History and Physical, chart, labs and discussed the procedure including the risks, benefits and alternatives for the proposed anesthesia with the patient or authorized representative who has indicated his/her understanding and acceptance.  ? ? ? ?Dental advisory given ? ?Plan Discussed with: CRNA and  Surgeon ? ?Anesthesia Plan Comments: (Discussed risks of anesthesia with patient, including possibility of difficulty with spontaneous ventilation under anesthesia necessitating airway intervention, PONV, and rare risks such as cardiac or respiratory or neurological events, and allergic reactions. Discussed the role of CRNA in patient's perioperative care. Patient understands.)  ? ? ? ? ? ? ?Anesthesia Quick Evaluation ? ?

## 2021-10-24 NOTE — Anesthesia Postprocedure Evaluation (Signed)
Anesthesia Post Note ? ?Patient: Cindy Martin ? ?Procedure(s) Performed: COLONOSCOPY WITH PROPOFOL ? ?Patient location during evaluation: Endoscopy ?Anesthesia Type: General ?Level of consciousness: awake and alert ?Pain management: pain level controlled ?Vital Signs Assessment: post-procedure vital signs reviewed and stable ?Respiratory status: spontaneous breathing, nonlabored ventilation, respiratory function stable and patient connected to nasal cannula oxygen ?Cardiovascular status: blood pressure returned to baseline and stable ?Postop Assessment: no apparent nausea or vomiting ?Anesthetic complications: no ? ? ?No notable events documented. ? ? ?Last Vitals:  ?Vitals:  ? 10/24/21 1104 10/24/21 1113  ?BP: 104/65 (!) 121/91  ?Pulse: 84 79  ?Resp: 13 20  ?Temp:    ?SpO2: 99% 100%  ?  ?Last Pain:  ?Vitals:  ? 10/24/21 1113  ?TempSrc:   ?PainSc: 0-No pain  ? ? ?  ?  ?  ?  ?  ?  ? ?Arita Miss ? ? ? ? ?

## 2021-10-24 NOTE — H&P (Signed)
? ? ? ?Jonathon Bellows, MD ?[address] [place], [place], [place], [address] ?[address] [place], [place], [place], [address] ?Phone: [phone]  ?Fax: [phone] ? ?Primary Care Physician:  Einar Pheasant, MD ? ? ?Pre-Procedure History & Physical: ?HPI:  Cindy Martin is a 51 y.o. female is here for an colonoscopy. ?  ?Past Medical History:  ?Diagnosis Date  ? Chicken pox   ? Hypertension   ? Menorrhagia   ? ? ?Past Surgical History:  ?Procedure Laterality Date  ? ABDOMINAL HYSTERECTOMY    ? CYSTOSCOPY N/A 01/30/2020  ? Procedure: CYSTOSCOPY;  Surgeon: Gae Dry, MD;  Location: [facility];  Service: Gynecology;  Laterality: N/A;  ? TOTAL LAPAROSCOPIC HYSTERECTOMY WITH SALPINGECTOMY Bilateral 01/30/2020  ? Procedure: TOTAL LAPAROSCOPIC HYSTERECTOMY WITH RIGHT SALPINGECTOMY;  Surgeon: Gae Dry, MD;  Location: [facility];  Service: Gynecology;  Laterality: Bilateral;  ? TUBAL LIGATION    ? Tube reversal    ? ? ?Prior to Admission medications   ?Medication Sig Start Date End Date Taking? Authorizing Provider  ?aspirin 81 MG EC tablet Take 1 tablet (81 mg total) by mouth daily. Swallow whole. 09/06/21  Yes Einar Pheasant, MD  ?valsartan-hydrochlorothiazide (DIOVAN-HCT) 80-12.5 MG tablet TAKE 1 TABLET BY MOUTH EVERY DAY 10/17/21  Yes Einar Pheasant, MD  ?tiZANidine (ZANAFLEX) 4 MG tablet Take 1 tablet (4 mg total) by mouth 2 (two) times daily as needed. 09/24/21   Einar Pheasant, MD  ? ? ?Allergies as of 10/17/2021  ? (No Known Allergies)  ? ? ?Family History  ?Problem Relation Age of Onset  ? Stroke Mother   ? Hypertension Mother   ? Arthritis Mother   ? Hypertension Sister   ? Asthma Daughter   ? Breast cancer Neg Hx   ? Ovarian cancer Neg Hx   ? ? ?Social History  ? ?Socioeconomic History  ? Marital status: Married  ?  Spouse name: Not on file  ? Number of children: Not on file  ? Years of education: Not on file  ? Highest education level: Not on file  ?Occupational History  ? Occupation:  Print production planner  ?Tobacco Use  ? Smoking status: Never  ? Smokeless tobacco: Never  ?Vaping Use  ? Vaping Use: Never used  ?Substance and Sexual Activity  ? Alcohol use: No  ? Drug use: No  ? Sexual activity: Yes  ?  Birth control/protection: Surgical  ?  Comment: Tubal ligation/reversed  ?Other Topics Concern  ? Not on file  ?Social History Narrative  ? Not on file  ? ?Social Determinants of Health  ? ?Financial Resource Strain: Not on file  ?Food Insecurity: Not on file  ?Transportation Needs: Not on file  ?Physical Activity: Not on file  ?Stress: Not on file  ?Social Connections: Not on file  ?Intimate Partner Violence: Not on file  ? ? ?Review of Systems: ?See HPI, otherwise negative ROS ? ?Physical Exam: ?BP (!) 130/96   Pulse 94   Temp (!) 97.2 ?F (36.2 ?C) (Temporal)   Resp 16   Ht '5\' 9"'$  (1.753 m)   Wt 104.3 kg   LMP 01/02/2020 (Exact Date)   SpO2 98%   BMI 33.97 kg/m?  ?General:   Alert,  pleasant and cooperative in NAD ?Head:  Normocephalic and atraumatic. ?Neck:  Supple; no masses or thyromegaly. ?Lungs:  Clear throughout to auscultation, normal respiratory effort.    ?Heart:  +S1, +S2, Regular rate and rhythm, No edema. ?Abdomen:  Soft, nontender  and nondistended. Normal bowel sounds, without guarding, and without rebound.   ?Neurologic:  Alert and  oriented x4;  grossly normal neurologically. ? ?Impression/Plan: ?Cindy Martin is here for an colonoscopy to be performed for Screening colonoscopy average risk   ?Risks, benefits, limitations, and alternatives regarding  colonoscopy have been reviewed with the patient.  Questions have been answered.  All parties agreeable. ? ? ?Jonathon Bellows, MD  10/24/2021, 10:32 AM ? ?

## 2021-10-26 NOTE — Progress Notes (Signed)
Non-identified Voicemail.  No Message Left. 

## 2021-10-27 ENCOUNTER — Encounter: Payer: Self-pay | Admitting: Gastroenterology

## 2021-10-28 ENCOUNTER — Encounter: Payer: Self-pay | Admitting: Gastroenterology

## 2021-10-28 LAB — SURGICAL PATHOLOGY

## 2021-11-12 ENCOUNTER — Other Ambulatory Visit: Payer: Self-pay | Admitting: Internal Medicine

## 2021-12-13 ENCOUNTER — Other Ambulatory Visit: Payer: Self-pay | Admitting: Internal Medicine

## 2022-01-11 ENCOUNTER — Other Ambulatory Visit: Payer: Self-pay | Admitting: Internal Medicine

## 2022-01-17 DIAGNOSIS — E041 Nontoxic single thyroid nodule: Secondary | ICD-10-CM

## 2022-01-17 HISTORY — DX: Nontoxic single thyroid nodule: E04.1

## 2022-01-22 ENCOUNTER — Encounter: Payer: Self-pay | Admitting: Internal Medicine

## 2022-01-22 ENCOUNTER — Ambulatory Visit (INDEPENDENT_AMBULATORY_CARE_PROVIDER_SITE_OTHER): Payer: No Typology Code available for payment source | Admitting: Internal Medicine

## 2022-01-22 VITALS — BP 132/78 | HR 79 | Temp 98.2°F | Resp 15 | Ht 69.0 in | Wt 240.0 lb

## 2022-01-22 DIAGNOSIS — E041 Nontoxic single thyroid nodule: Secondary | ICD-10-CM

## 2022-01-22 DIAGNOSIS — R930 Abnormal findings on diagnostic imaging of skull and head, not elsewhere classified: Secondary | ICD-10-CM

## 2022-01-22 DIAGNOSIS — E785 Hyperlipidemia, unspecified: Secondary | ICD-10-CM | POA: Diagnosis not present

## 2022-01-22 DIAGNOSIS — Z114 Encounter for screening for human immunodeficiency virus [HIV]: Secondary | ICD-10-CM

## 2022-01-22 DIAGNOSIS — I10 Essential (primary) hypertension: Secondary | ICD-10-CM

## 2022-01-22 DIAGNOSIS — Z0000 Encounter for general adult medical examination without abnormal findings: Secondary | ICD-10-CM | POA: Diagnosis not present

## 2022-01-22 DIAGNOSIS — F439 Reaction to severe stress, unspecified: Secondary | ICD-10-CM

## 2022-01-22 DIAGNOSIS — Z1159 Encounter for screening for other viral diseases: Secondary | ICD-10-CM

## 2022-01-22 DIAGNOSIS — Z1231 Encounter for screening mammogram for malignant neoplasm of breast: Secondary | ICD-10-CM

## 2022-01-22 DIAGNOSIS — Z1211 Encounter for screening for malignant neoplasm of colon: Secondary | ICD-10-CM

## 2022-01-22 LAB — BASIC METABOLIC PANEL
BUN: 23 mg/dL (ref 6–23)
CO2: 27 mEq/L (ref 19–32)
Calcium: 8.9 mg/dL (ref 8.4–10.5)
Chloride: 104 mEq/L (ref 96–112)
Creatinine, Ser: 0.91 mg/dL (ref 0.40–1.20)
GFR: 74.51 mL/min (ref >60.00)
Glucose, Bld: 95 mg/dL (ref 70–99)
Potassium: 3.9 mEq/L (ref 3.5–5.1)
Sodium: 139 mEq/L (ref 135–145)

## 2022-01-22 LAB — HEPATIC FUNCTION PANEL
ALT: 19 U/L (ref 0–35)
AST: 17 U/L (ref 0–37)
Albumin: 4.2 g/dL (ref 3.5–5.2)
Alkaline Phosphatase: 66 U/L (ref 39–117)
Bilirubin, Direct: 0.1 mg/dL (ref 0.0–0.3)
Total Bilirubin: 0.5 mg/dL (ref 0.2–1.2)
Total Protein: 6.2 g/dL (ref 6.0–8.3)

## 2022-01-22 LAB — T4, FREE: Free T4: 0.97 ng/dL (ref 0.60–1.60)

## 2022-01-22 LAB — LIPID PANEL
Cholesterol: 178 mg/dL (ref 0–200)
HDL: 41.8 mg/dL (ref >39.00)
LDL Cholesterol: 108 mg/dL — ABNORMAL HIGH (ref 0–99)
NonHDL: 136.68
Total CHOL/HDL Ratio: 4
Triglycerides: 143 mg/dL (ref 0.0–149.0)
VLDL: 28.6 mg/dL (ref 0.0–40.0)

## 2022-01-22 LAB — TSH: TSH: 1 u[IU]/mL (ref 0.35–5.50)

## 2022-01-22 NOTE — Assessment & Plan Note (Signed)
Blood pressure elevated on my check.  Increased stress.  Blood pressure has been doing better. Continue diovan/hctz. Hold on changes.  Follow pressures.  Follow metabolic panel. Send in readings.  Get her back in soon to reassess.  

## 2022-01-22 NOTE — Progress Notes (Signed)
Patient ID: Cindy Martin, female   DOB: 1970-09-03, 51 y.o.   MRN: [id number]   Subjective:    Patient ID: Cindy Martin, female    DOB: February 09, 1971, 51 y.o.   MRN: [id number]   Patient here for her physical exam.    Hypertension Pertinent negatives include no chest pain, headaches, palpitations or shortness of breath.   She gets her breast, pelvic and pap smears at gyn.  Reports she is doing relatively well.  Stays active.  No chest pain or sob reported.  No abdominal pain or bowel change reported.  Had colonoscopy 10/24/21.  Recommended f/u in 3 years.  Has had some days feels more emotional.  Question anxiety.  No stress.  Has good support. Does not feel needs anything  more at this time.  Will notify me if feels needs further intervention. Blood pressures on outside checks - 453M systolic readings.     Past Medical History:  Diagnosis Date   Chicken pox    Hypertension    Menorrhagia    Past Surgical History:  Procedure Laterality Date   ABDOMINAL HYSTERECTOMY     COLONOSCOPY WITH PROPOFOL N/A 10/24/2021   Procedure: COLONOSCOPY WITH PROPOFOL;  Surgeon: Jonathon Bellows, MD;  Location: [facility];  Service: Gastroenterology;  Laterality: N/A;   CYSTOSCOPY N/A 01/30/2020   Procedure: CYSTOSCOPY;  Surgeon: Gae Dry, MD;  Location: [facility];  Service: Gynecology;  Laterality: N/A;   TOTAL LAPAROSCOPIC HYSTERECTOMY WITH SALPINGECTOMY Bilateral 01/30/2020   Procedure: TOTAL LAPAROSCOPIC HYSTERECTOMY WITH RIGHT SALPINGECTOMY;  Surgeon: Gae Dry, MD;  Location: [facility];  Service: Gynecology;  Laterality: Bilateral;   TUBAL LIGATION     Tube reversal     Family History  Problem Relation Age of Onset   Stroke Mother    Hypertension Mother    Arthritis Mother    Hypertension Sister    Asthma Daughter    Breast cancer Neg Hx    Ovarian cancer Neg Hx    Social History   Socioeconomic History   Marital status: Married    Spouse name: Not on file   Number of  children: Not on file   Years of education: Not on file   Highest education level: Not on file  Occupational History   Occupation: Preschool Teacher  Tobacco Use   Smoking status: Never   Smokeless tobacco: Never  Vaping Use   Vaping Use: Never used  Substance and Sexual Activity   Alcohol use: No   Drug use: No   Sexual activity: Yes    Birth control/protection: Surgical    Comment: Tubal ligation/reversed  Other Topics Concern   Not on file  Social History Narrative   Not on file   Social Determinants of Health   Financial Resource Strain: Not on file  Food Insecurity: Not on file  Transportation Needs: Not on file  Physical Activity: Not on file  Stress: Not on file  Social Connections: Not on file     Review of Systems  Constitutional:  Negative for appetite change and unexpected weight change.  HENT:  Negative for congestion and sinus pressure.   Respiratory:  Negative for cough, chest tightness and shortness of breath.   Cardiovascular:  Negative for chest pain, palpitations and leg swelling.  Gastrointestinal:  Negative for abdominal pain, diarrhea, nausea and vomiting.  Genitourinary:  Negative for difficulty urinating and dysuria.  Musculoskeletal:  Negative for joint swelling and myalgias.  Skin:  Negative for color change and  rash.  Neurological:  Negative for dizziness, light-headedness and headaches.  Psychiatric/Behavioral:  Negative for agitation and dysphoric mood.        Fluctuating emotions as outlined.        Objective:     BP 132/78 (BP Location: Left Arm, Patient Position: Sitting, Cuff Size: Large)   Pulse 79   Temp 98.2 F (36.8 C) (Temporal)   Resp 15   Ht '5\' 9"'$  (1.753 m)   Wt 240 lb (108.9 kg)   LMP 01/02/2020 (Exact Date)   SpO2 98%   BMI 35.44 kg/m  Wt Readings from Last 3 Encounters:  01/22/22 240 lb (108.9 kg)  10/24/21 230 lb (104.3 kg)  10/15/21 233 lb (105.7 kg)    Physical Exam Vitals reviewed.  Constitutional:       General: She is not in acute distress.    Appearance: Normal appearance.  HENT:     Head: Normocephalic and atraumatic.     Right Ear: External ear normal.     Left Ear: External ear normal.  Eyes:     General: No scleral icterus.       Right eye: No discharge.        Left eye: No discharge.     Conjunctiva/sclera: Conjunctivae normal.  Neck:     Thyroid: No thyromegaly.  Cardiovascular:     Rate and Rhythm: Normal rate and regular rhythm.  Pulmonary:     Effort: No respiratory distress.     Breath sounds: Normal breath sounds. No wheezing.  Abdominal:     General: Bowel sounds are normal.     Palpations: Abdomen is soft.     Tenderness: There is no abdominal tenderness.  Musculoskeletal:        General: No swelling or tenderness.     Cervical back: Neck supple. No tenderness.  Lymphadenopathy:     Cervical: No cervical adenopathy.  Skin:    Findings: No erythema or rash.  Neurological:     Mental Status: She is alert.  Psychiatric:        Mood and Affect: Mood normal.        Behavior: Behavior normal.      Outpatient Encounter Medications as of 01/22/2022  Medication Sig   aspirin 81 MG EC tablet Take 1 tablet (81 mg total) by mouth daily. Swallow whole.   tiZANidine (ZANAFLEX) 4 MG tablet Take 1 tablet (4 mg total) by mouth 2 (two) times daily as needed.   valsartan-hydrochlorothiazide (DIOVAN-HCT) 80-12.5 MG tablet TAKE 1 TABLET BY MOUTH EVERY DAY   No facility-administered encounter medications on file as of 01/22/2022.     Lab Results  Component Value Date   WBC 6.8 09/05/2021   HGB 14.9 09/05/2021   HCT 45.2 09/05/2021   PLT 232.0 09/05/2021   GLUCOSE 95 01/22/2022   CHOL 178 01/22/2022   TRIG 143.0 01/22/2022   HDL 41.80 01/22/2022   LDLCALC 108 (H) 01/22/2022   ALT 19 01/22/2022   AST 17 01/22/2022   NA 139 01/22/2022   K 3.9 01/22/2022   CL 104 01/22/2022   CREATININE 0.91 01/22/2022   BUN 23 01/22/2022   CO2 27 01/22/2022   TSH 1.00 01/22/2022    HGBA1C 5.6 12/12/2019       Assessment & Plan:   Problem List Items Addressed This Visit     Abnormal CT scan of head    CT head noted a 0.8 cm hypodense focus in the left frontal lobe. In the absence  of acute neurologic deficits, this is favored to represent a remote infarct versus chronic white matter changes. No acute intracranial hemorrhage. CTA head - as outlined.  Given findings, MRI brain obtained and saw neurology. MRI - Mild white matter microvascular ischemic and metabolic changes.  Continue ECASA '81mg'$  q day.       Colon cancer screening    Colonoscopy 10/2021 - recommended f/u in 3 years.       Healthcare maintenance    Was scheduled for physical today.  Gets breasts, pelvic and pap smears through gyn.  Schedule mammogram.  Colonoscopy 10/2021 - recommended f/u in 3 years.        Hypertension, essential - Primary    Blood pressure doing much better  Continue diovan/hctz.  Follow pressures.  Follow metabolic panel.       Relevant Orders   Basic Metabolic Panel (BMET) (Completed)   Stress    Work is better with adjusted work schedule.  Some increased anxiety/emotion changes as outlined.  Discussed.  Does not feel needs any further intervention at this time.  Follow.       Thyroid nodule    Had thyroid ultrasound 09/2021.  Saw Dr Gabriel Carina.  Planning for biopsy Monday.       Relevant Orders   TSH (Completed)   T4, free (Completed)   Other Visit Diagnoses     Hyperlipidemia, unspecified hyperlipidemia type       Relevant Orders   Hepatic function panel (Completed)   Lipid Profile (Completed)   Screening for HIV (human immunodeficiency virus)       Relevant Orders   HIV antibody (with reflex)   Encounter for hepatitis C screening test for low risk patient       Relevant Orders   Hepatitis C Antibody   Encounter for screening mammogram for malignant neoplasm of breast       Relevant Orders   MM 3D SCREEN BREAST BILATERAL        Einar Pheasant, MD

## 2022-01-22 NOTE — Assessment & Plan Note (Signed)
Had thyroid ultrasound 09/2021.  Saw Dr Gabriel Carina.  Planning for biopsy Monday.

## 2022-01-23 ENCOUNTER — Encounter: Payer: Self-pay | Admitting: Internal Medicine

## 2022-01-23 DIAGNOSIS — Z0000 Encounter for general adult medical examination without abnormal findings: Secondary | ICD-10-CM | POA: Insufficient documentation

## 2022-01-23 LAB — HEPATITIS C ANTIBODY: Hepatitis C Ab: NONREACTIVE

## 2022-01-23 LAB — HIV ANTIBODY (ROUTINE TESTING W REFLEX): HIV 1&2 Ab, 4th Generation: NONREACTIVE

## 2022-01-23 NOTE — Assessment & Plan Note (Signed)
Was scheduled for physical today.  Gets breasts, pelvic and pap smears through gyn.  Schedule mammogram.  Colonoscopy 10/2021 - recommended f/u in 3 years.

## 2022-01-23 NOTE — Assessment & Plan Note (Signed)
Colonoscopy 10/2021 - recommended f/u in 3 years.  

## 2022-01-23 NOTE — Assessment & Plan Note (Signed)
Work is better with adjusted work schedule.  Some increased anxiety/emotion changes as outlined.  Discussed.  Does not feel needs any further intervention at this time.  Follow.

## 2022-01-23 NOTE — Assessment & Plan Note (Signed)
CT head noted a 0.8 cm hypodense focus in the left frontal lobe. In the absence of acute neurologic deficits, this is favored to represent a remote infarct versus chronic white matter changes. No acute intracranial hemorrhage. CTA head - as outlined.  Given findings, MRI brain obtained and saw neurology. MRI - Mild white matter microvascular ischemic and metabolic changes.  Continue ECASA '81mg'$  q day.

## 2022-01-26 ENCOUNTER — Telehealth: Payer: Self-pay

## 2022-01-26 NOTE — Telephone Encounter (Signed)
LMTCB for results. 

## 2022-02-09 ENCOUNTER — Other Ambulatory Visit: Payer: Self-pay | Admitting: Family

## 2022-02-10 ENCOUNTER — Ambulatory Visit
Admission: RE | Admit: 2022-02-10 | Discharge: 2022-02-10 | Disposition: A | Discharge status: Home / Self Care | Payer: No Typology Code available for payment source | Source: Ambulatory Visit | Attending: Internal Medicine | Admitting: Internal Medicine

## 2022-02-10 DIAGNOSIS — Z1231 Encounter for screening mammogram for malignant neoplasm of breast: Secondary | ICD-10-CM | POA: Diagnosis present

## 2022-02-11 NOTE — Telephone Encounter (Signed)
Patient called about medication refill, pharmacy requested she gives [place] a call, they have no record of refill request - valsartan-hydrochlorothiazide (DIOVAN-HCT) 80-12.5 MG tablet.

## 2022-03-07 ENCOUNTER — Encounter: Payer: Self-pay | Admitting: Internal Medicine

## 2022-03-08 ENCOUNTER — Telehealth: Payer: Self-pay | Admitting: Internal Medicine

## 2022-03-09 NOTE — Telephone Encounter (Signed)
LMTCB to see how patient is doing.

## 2022-05-07 IMAGING — MR MR HEAD WO/W CM
13 series · 48 of 48 positions shown · IV contrast (gadavist)
Comparison: Report from head CT 05/28/1996 (images unavailable).

CLINICAL DATA: Provided history: Non-intractable headache,
unspecified chronicity pattern, unspecified headache type.
Hypertension, essential. Abnormal CT scan of head. Stroke,
follow-up; headache, new or worsening. Additional history provided
by scanning technologist: Patient reports left-sided headache and
elevated blood pressure for 2-3 weeks.

EXAM:
MRI HEAD WITHOUT AND WITH CONTRAST
TECHNIQUE: Multiplanar, multiecho pulse sequences of the brain and surrounding
structures were obtained without and with intravenous contrast.
CONTRAST:  10mL GADAVIST GADOBUTROL 1 MMOL/ML IV SOLN

[Series 5: ax dwi_tracew · axial · 3.0mm · 0.65mm/px · z∈[-84,+70]mm · 3 of 48 slices shown]
[im 1/48]
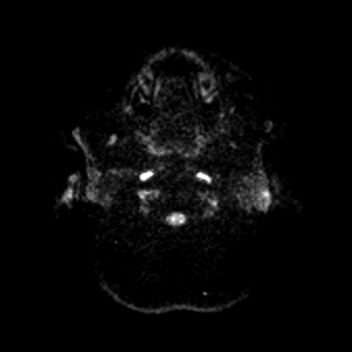
[im 24/48]
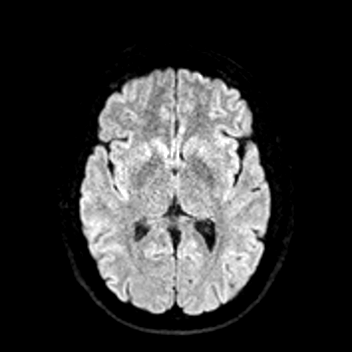
[im 48/48]
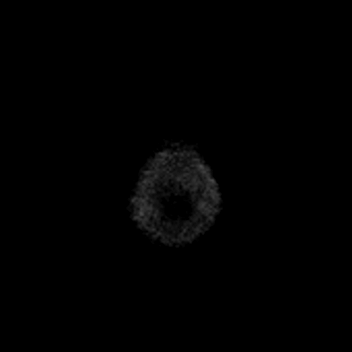

[Series 6: ax dwi_adc · axial · 3.0mm · 0.65mm/px · z∈[-84,+70]mm · 3 of 48 slices shown]
[im 1/48]
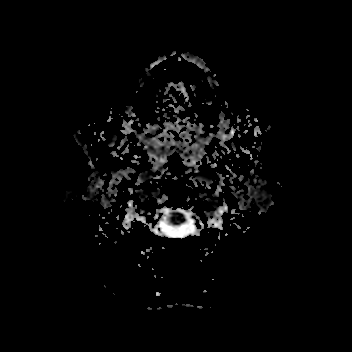
[im 24/48]
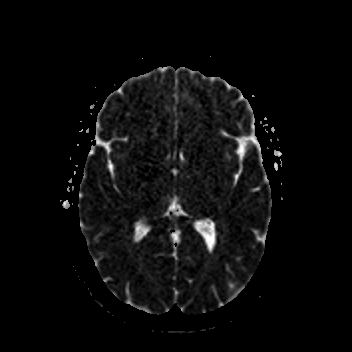
[im 48/48]
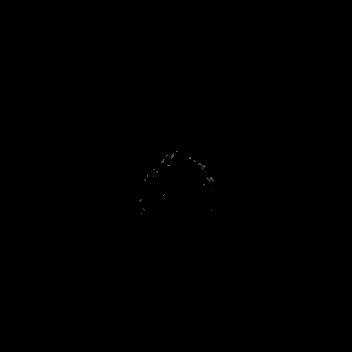

[Series 7: cor dwi_tracew · coronal · 5.0mm · 0.68mm/px · 2 of 40 slices shown]
[im 1/40]
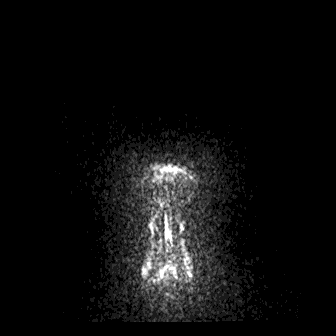
[im 40/40]
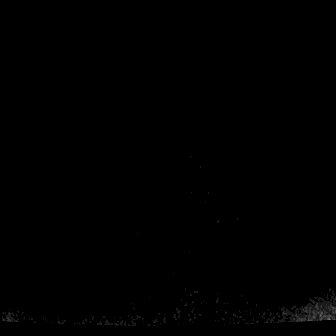

[Series 8: cor dwi_adc · coronal · 5.0mm · 0.68mm/px · 2 of 39 slices shown]
[im 1/39]
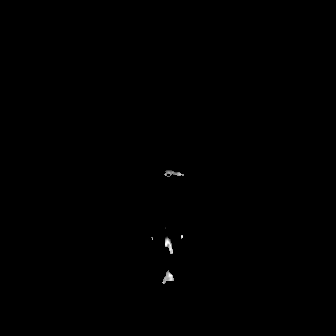
[im 39/39]
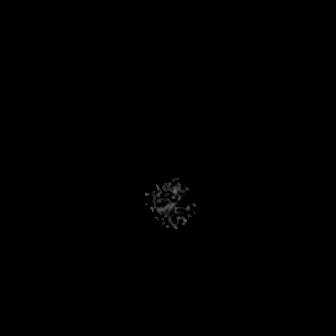

[Series 9: T1 · sagittal · 5.0mm · 0.62mm/px · 1 of 23 slices shown (1 of 2)]
[im 1/23]
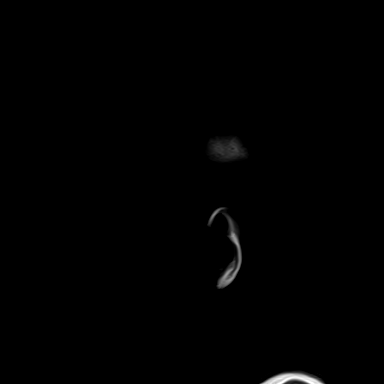

[Series 10: T2 · axial · 5.0mm · 0.53mm/px · z∈[-81,+74]mm · 2 of 27 slices shown]
[im 1/27]
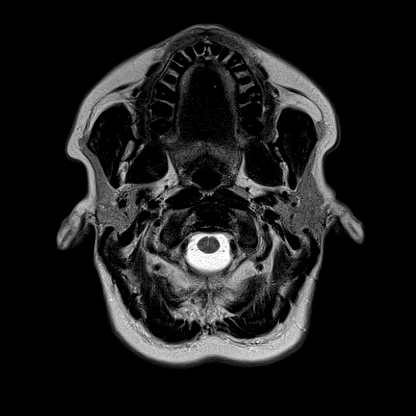
[im 27/27]
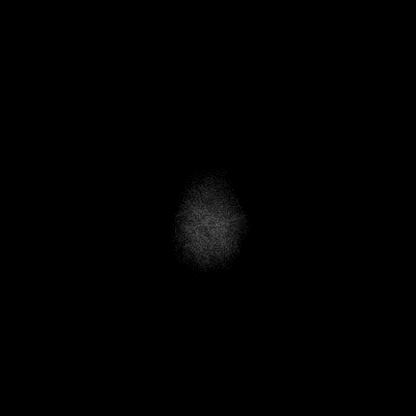

[Series 12: pha_images · axial · 3.0mm · 0.90mm/px · z∈[-93,+80]mm · 4 of 59 slices shown]
[im 1/59]
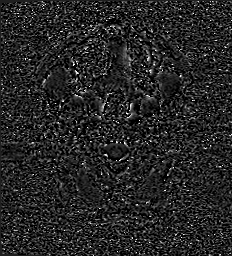
[im 20/59]
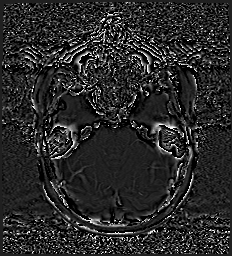
[im 39/59]
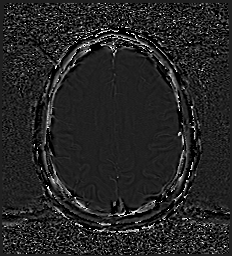
[im 59/59]
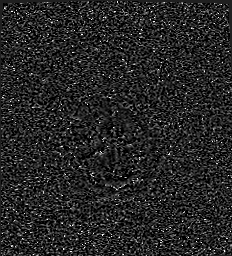

[Series 13: swi_images · axial · 3.0mm · 0.90mm/px · z∈[-93,+83]mm · 4 of 60 slices shown]
[im 1/60]
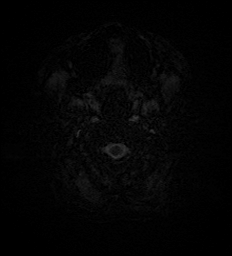
[im 20/60]
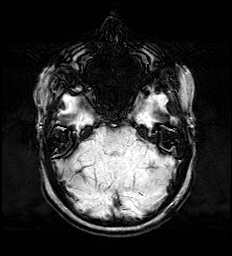
[im 40/60]
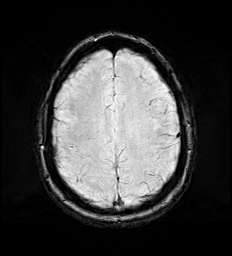
[im 60/60]
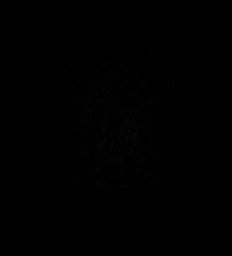

[Series 15: FLAIR · axial · 3.0mm · 0.53mm/px · z∈[-84,+77]mm · 3 of 55 slices shown]
[im 1/55]
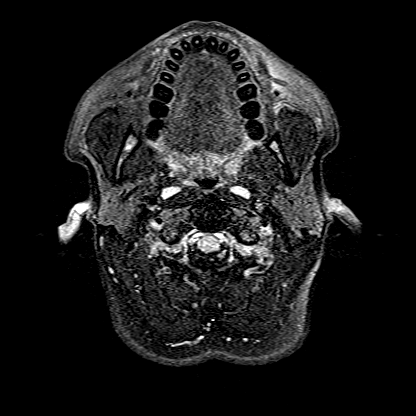
[im 28/55]
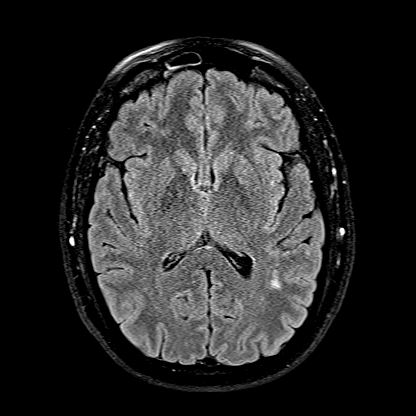
[im 55/55]
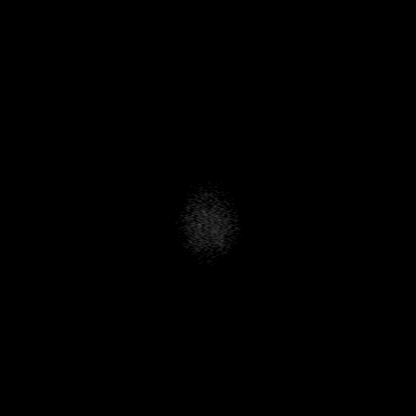

[Series 16: T1 · axial · 1.0mm · 0.98mm/px · z∈[-90,+84]mm · 10 of 175 slices shown (2 of 2)]
[im 1/175]
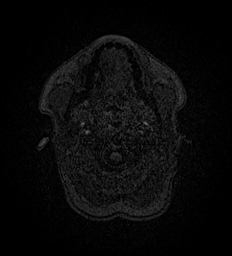
[im 20/175]
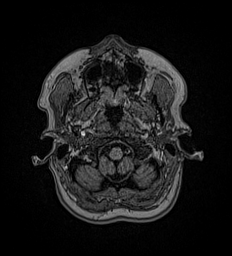
[im 39/175]
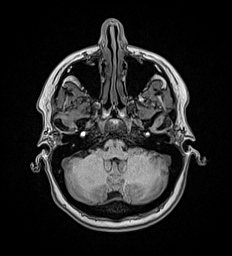
[im 59/175]
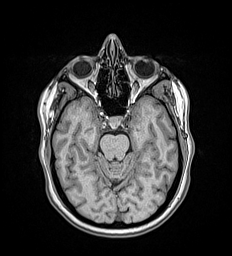
[im 78/175]
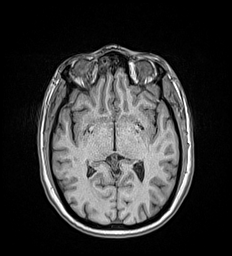
[im 97/175]
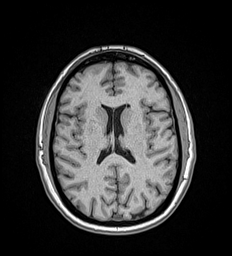
[im 117/175]
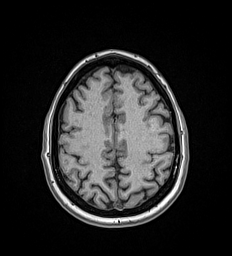
[im 136/175]
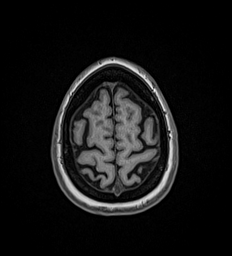
[im 155/175]
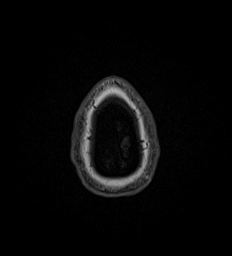
[im 175/175]
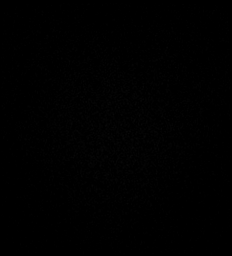

[Series 17: T2 post-contrast · coronal · 5.0mm · 0.57mm/px · 2 of 29 slices shown]
[im 1/29]
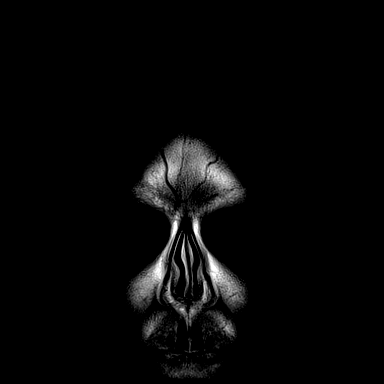
[im 29/29]
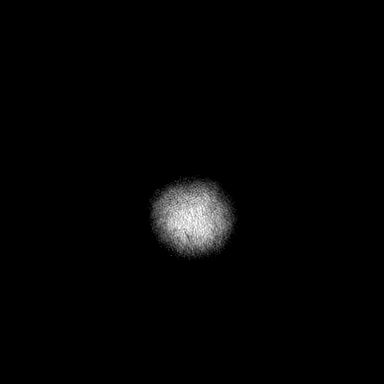

[Series 18: T1 post-contrast · axial · 1.0mm · 0.98mm/px · z∈[-90,+84]mm · 10 of 176 slices shown (1 of 2)]
[im 1/176]
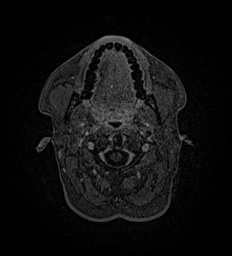
[im 20/176]
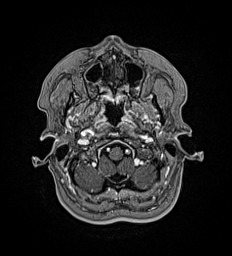
[im 39/176]
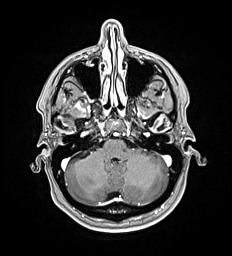
[im 59/176]
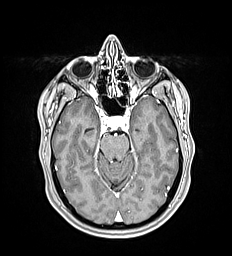
[im 78/176]
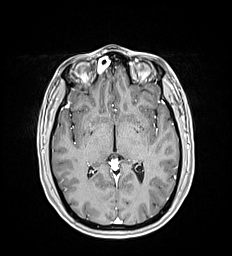
[im 98/176]
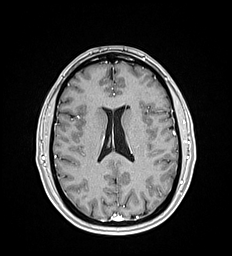
[im 117/176]
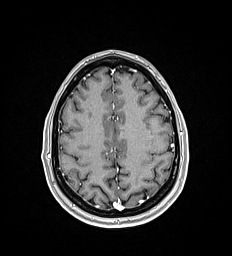
[im 137/176]
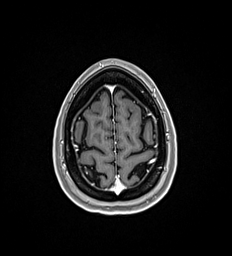
[im 156/176]
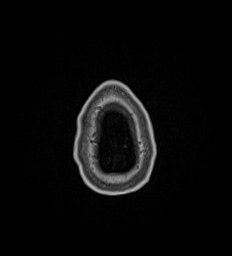
[im 176/176]
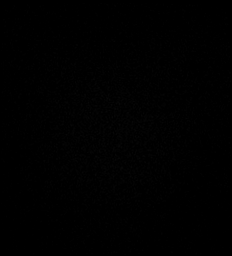

[Series 19: T1 post-contrast · coronal · 5.0mm · 0.57mm/px · 2 of 29 slices shown (2 of 2)]
[im 1/29]
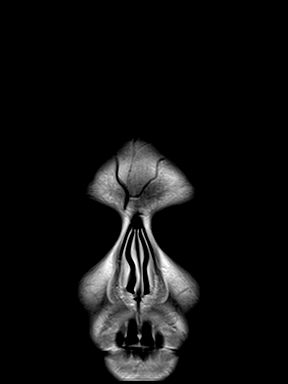
[im 29/29]
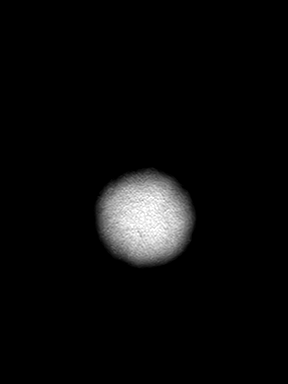

[48 of 48 positions shown; findings below may reference images not displayed]

FINDINGS: Brain:

Cerebral volume is normal.

Multifocal T2 FLAIR hyperintense signal abnormality within the
cerebral white matter, mild but advanced for age.

There is no acute infarct.

No evidence of an intracranial mass.

No chronic intracranial blood products.

No extra-axial fluid collection.

No midline shift.

No pathologic intracranial enhancement identified.

Vascular: Maintained flow voids within the proximal large arterial
vessels.

Skull and upper cervical spine: No focal suspicious marrow lesion.

Sinuses/Orbits: Moderate mucosal thickening, and possible fluid,
within the right frontoethmoidal recess and anterior right ethmoid
air cells. Mild mucosal thickening, and possible fluid, within
posterior left ethmoid air cells. Mild mucosal thickening within the
left sphenoid sinus.
IMPRESSION: No evidence of acute intracranial abnormality.

Multifocal T2 FLAIR hyperintense signal abnormality within the
cerebral white matter, mild but advanced for age. These signal
changes are nonspecific and differential considerations include
chronic small vessel ischemic disease, sequela of chronic migraine
headaches, sequela of a prior infectious/inflammatory process or
less likely (given the distribution) sequela of a demyelinating
process (such as multiple sclerosis), among others.

Otherwise unremarkable MRI appearance of the brain.

Paranasal sinus disease, as described.

## 2022-05-13 ENCOUNTER — Other Ambulatory Visit: Payer: Self-pay

## 2022-05-13 MED ORDER — VALSARTAN-HYDROCHLOROTHIAZIDE 80-12.5 MG PO TABS: 1.0000 | ORAL_TABLET | Freq: Every day | ORAL | 3 refills | Status: DC

## 2022-05-13 NOTE — Telephone Encounter (Signed)
Refill sent to preferred pharmacy. 

## 2022-05-13 NOTE — Telephone Encounter (Signed)
Pt need a refill on valsartan-hydrochlorothiazide sent to [facility]

## 2022-05-14 ENCOUNTER — Other Ambulatory Visit: Payer: Self-pay | Admitting: *Deleted

## 2022-05-14 MED ORDER — VALSARTAN-HYDROCHLOROTHIAZIDE 80-12.5 MG PO TABS: 1.0000 | ORAL_TABLET | Freq: Every day | ORAL | 3 refills | Status: DC

## 2022-05-18 ENCOUNTER — Encounter (INDEPENDENT_AMBULATORY_CARE_PROVIDER_SITE_OTHER): Payer: Self-pay

## 2022-05-18 ENCOUNTER — Other Ambulatory Visit: Payer: Self-pay | Admitting: *Deleted

## 2022-05-18 MED ORDER — VALSARTAN-HYDROCHLOROTHIAZIDE 80-12.5 MG PO TABS: 1.0000 | ORAL_TABLET | Freq: Every day | ORAL | 1 refills | Status: DC

## 2022-05-25 ENCOUNTER — Ambulatory Visit: Payer: No Typology Code available for payment source | Admitting: Internal Medicine

## 2022-05-25 ENCOUNTER — Encounter: Payer: Self-pay | Admitting: Internal Medicine

## 2022-05-25 VITALS — BP 136/80 | HR 73 | Temp 98.6°F | Resp 15 | Ht 69.0 in | Wt 238.6 lb

## 2022-05-25 DIAGNOSIS — E7800 Pure hypercholesterolemia, unspecified: Secondary | ICD-10-CM

## 2022-05-25 DIAGNOSIS — F439 Reaction to severe stress, unspecified: Secondary | ICD-10-CM

## 2022-05-25 DIAGNOSIS — I10 Essential (primary) hypertension: Secondary | ICD-10-CM | POA: Diagnosis not present

## 2022-05-25 DIAGNOSIS — R21 Rash and other nonspecific skin eruption: Secondary | ICD-10-CM

## 2022-05-25 DIAGNOSIS — E041 Nontoxic single thyroid nodule: Secondary | ICD-10-CM | POA: Diagnosis not present

## 2022-05-25 LAB — TSH: TSH: 1.48 u[IU]/mL (ref 0.35–5.50)

## 2022-05-25 LAB — LIPID PANEL
Cholesterol: 184 mg/dL (ref 0–200)
HDL: 51 mg/dL (ref >39.00)
LDL Cholesterol: 97 mg/dL (ref 0–99)
NonHDL: 133.15
Total CHOL/HDL Ratio: 4
Triglycerides: 179 mg/dL — ABNORMAL HIGH (ref 0.0–149.0)
VLDL: 35.8 mg/dL (ref 0.0–40.0)

## 2022-05-25 LAB — HEPATIC FUNCTION PANEL
ALT: 20 U/L (ref 0–35)
AST: 17 U/L (ref 0–37)
Albumin: 4.2 g/dL (ref 3.5–5.2)
Alkaline Phosphatase: 70 U/L (ref 39–117)
Bilirubin, Direct: 0.1 mg/dL (ref 0.0–0.3)
Total Bilirubin: 0.3 mg/dL (ref 0.2–1.2)
Total Protein: 6.6 g/dL (ref 6.0–8.3)

## 2022-05-25 LAB — BASIC METABOLIC PANEL
BUN: 25 mg/dL — ABNORMAL HIGH (ref 6–23)
CO2: 25 mEq/L (ref 19–32)
Calcium: 9.2 mg/dL (ref 8.4–10.5)
Chloride: 105 mEq/L (ref 96–112)
Creatinine, Ser: 0.93 mg/dL (ref 0.40–1.20)
GFR: 72.42 mL/min (ref >60.00)
Glucose, Bld: 95 mg/dL (ref 70–99)
Potassium: 3.9 mEq/L (ref 3.5–5.1)
Sodium: 140 mEq/L (ref 135–145)

## 2022-05-25 MED ORDER — SERTRALINE HCL 50 MG PO TABS: ORAL_TABLET | ORAL | 2 refills | Status: DC

## 2022-05-25 MED ORDER — TRIAMCINOLONE ACETONIDE 0.1 % EX CREA: 1.0000 | TOPICAL_CREAM | Freq: Two times a day (BID) | CUTANEOUS | 0 refills | Status: DC

## 2022-05-25 NOTE — Assessment & Plan Note (Signed)
Blood pressure elevated on my check.  Increased stress.  Blood pressure has been doing better. Continue diovan/hctz. Hold on changes.  Follow pressures.  Follow metabolic panel. Send in readings.  Get her back in soon to reassess.

## 2022-05-25 NOTE — Assessment & Plan Note (Signed)
S/p thyroid biopsy - benign (evaluated 01/2022).  Recommended f/u thyroid ultrasound and appt in one year. 

## 2022-05-25 NOTE — Progress Notes (Unsigned)
Patient ID: Cindy Martin, female   DOB: Dec 12, 1970, 51 y.o.   MRN: [id number]   Subjective:    Patient ID: Cindy Martin, female    DOB: [birth year], 51 y.o.   MRN: [id number]   Patient here for  Chief Complaint  Patient presents with   Follow-up   Rash    Ankle on bilateral legs, and chest.    .   HPI Here to follow up regarding her blood pressure and increased stress.  S/p thyroid biopsy - benign (evaluated 01/2022).  Recommended f/u thyroid ultrasound and appt in one year. Increased stress.  Work stress.  Discussed.  Feels needs something to help level things out.  Discussed treatment options.  Is walking.  No chest pain or sob reported.  No abdominal pain or bowel change reported.  Reports rash - ankles.  States occurs when cold.  Itches.  Applying lotion.     Past Medical History:  Diagnosis Date   Chicken pox    Hypertension    Menorrhagia    Past Surgical History:  Procedure Laterality Date   ABDOMINAL HYSTERECTOMY     COLONOSCOPY WITH PROPOFOL N/A 10/24/2021   Procedure: COLONOSCOPY WITH PROPOFOL;  Surgeon: Jonathon Bellows, MD;  Location: [facility] ENDOSCOPY;  Service: Gastroenterology;  Laterality: N/A;   CYSTOSCOPY N/A 01/30/2020   Procedure: CYSTOSCOPY;  Surgeon: Gae Dry, MD;  Location: [facility] ORS;  Service: Gynecology;  Laterality: N/A;   TOTAL LAPAROSCOPIC HYSTERECTOMY WITH SALPINGECTOMY Bilateral 01/30/2020   Procedure: TOTAL LAPAROSCOPIC HYSTERECTOMY WITH RIGHT SALPINGECTOMY;  Surgeon: Gae Dry, MD;  Location: [facility] ORS;  Service: Gynecology;  Laterality: Bilateral;   TUBAL LIGATION     Tube reversal     Family History  Problem Relation Age of Onset   Stroke Mother    Hypertension Mother    Arthritis Mother    Hypertension Sister    Asthma Daughter    Breast cancer Paternal Grandmother    Ovarian cancer Neg Hx    Social History   Socioeconomic History   Marital status: Married    Spouse name: Not on file   Number of children: Not on file    Years of education: Not on file   Highest education level: Not on file  Occupational History   Occupation: Preschool Teacher  Tobacco Use   Smoking status: Never   Smokeless tobacco: Never  Vaping Use   Vaping Use: Never used  Substance and Sexual Activity   Alcohol use: No   Drug use: No   Sexual activity: Yes    Birth control/protection: Surgical    Comment: Tubal ligation/reversed  Other Topics Concern   Not on file  Social History Narrative   Not on file   Social Determinants of Health   Financial Resource Strain: Not on file  Food Insecurity: Not on file  Transportation Needs: Not on file  Physical Activity: Not on file  Stress: Not on file  Social Connections: Not on file     Review of Systems  Constitutional:  Negative for appetite change and unexpected weight change.  HENT:  Negative for congestion and sinus pressure.   Respiratory:  Negative for cough, chest tightness and shortness of breath.   Cardiovascular:  Negative for chest pain and palpitations.       No increased swelling.   Gastrointestinal:  Negative for abdominal pain, diarrhea, nausea and vomiting.  Genitourinary:  Negative for difficulty urinating and dysuria.  Musculoskeletal:  Negative for joint swelling and myalgias.  Skin:  Positive for rash. Negative for color change.  Neurological:  Negative for dizziness, light-headedness and headaches.  Psychiatric/Behavioral:  Negative for agitation.        Increased stress as outlined.         Objective:     BP 136/80 (BP Location: Left Arm, Patient Position: Sitting, Cuff Size: Large)   Pulse 73   Temp 98.6 F (37 C) (Temporal)   Resp 15   Ht '5\' 9"'$  (1.753 m)   Wt 238 lb 9.6 oz (108.2 kg)   LMP 01/02/2020 (Exact Date)   SpO2 97%   BMI 35.24 kg/m  Wt Readings from Last 3 Encounters:  05/25/22 238 lb 9.6 oz (108.2 kg)  01/22/22 240 lb (108.9 kg)  10/24/21 230 lb (104.3 kg)    Physical Exam Vitals reviewed.  Constitutional:       General: She is not in acute distress.    Appearance: Normal appearance.  HENT:     Head: Normocephalic and atraumatic.     Right Ear: External ear normal.     Left Ear: External ear normal.  Eyes:     General: No scleral icterus.       Right eye: No discharge.        Left eye: No discharge.     Conjunctiva/sclera: Conjunctivae normal.  Neck:     Thyroid: No thyromegaly.  Cardiovascular:     Rate and Rhythm: Normal rate and regular rhythm.  Pulmonary:     Effort: No respiratory distress.     Breath sounds: Normal breath sounds. No wheezing.  Abdominal:     General: Bowel sounds are normal.     Palpations: Abdomen is soft.     Tenderness: There is no abdominal tenderness.  Musculoskeletal:        General: No swelling or tenderness.     Cervical back: Neck supple. No tenderness.  Lymphadenopathy:     Cervical: No cervical adenopathy.  Skin:    Comments: Erythematous rash - ankles - bilateral.  Also, minimal erythema (small linear/patch) anterior chest.    Neurological:     Mental Status: She is alert.  Psychiatric:        Mood and Affect: Mood normal.        Behavior: Behavior normal.      Outpatient Encounter Medications as of 05/25/2022  Medication Sig   aspirin 81 MG EC tablet Take 1 tablet (81 mg total) by mouth daily. Swallow whole.   sertraline (ZOLOFT) 50 MG tablet 1/2 tablet q day x 1 week and then one po q day   triamcinolone cream (KENALOG) 0.1 % Apply 1 Application topically 2 (two) times daily.   valsartan-hydrochlorothiazide (DIOVAN-HCT) 80-12.5 MG tablet Take 1 tablet by mouth daily.   [DISCONTINUED] tiZANidine (ZANAFLEX) 4 MG tablet Take 1 tablet (4 mg total) by mouth 2 (two) times daily as needed.   No facility-administered encounter medications on file as of 05/25/2022.     Lab Results  Component Value Date   WBC 6.8 09/05/2021   HGB 14.9 09/05/2021   HCT 45.2 09/05/2021   PLT 232.0 09/05/2021   GLUCOSE 95 05/25/2022   CHOL 184 05/25/2022   TRIG  179.0 (H) 05/25/2022   HDL 51.00 05/25/2022   LDLCALC 97 05/25/2022   ALT 20 05/25/2022   AST 17 05/25/2022   NA 140 05/25/2022   K 3.9 05/25/2022   CL 105 05/25/2022   CREATININE 0.93 05/25/2022   BUN 25 (H) 05/25/2022  CO2 25 05/25/2022   TSH 1.48 05/25/2022   HGBA1C 5.6 12/12/2019    MM 3D SCREEN BREAST BILATERAL  Result Date: 02/11/2022 CLINICAL DATA:  Screening. EXAM: DIGITAL SCREENING BILATERAL MAMMOGRAM WITH TOMOSYNTHESIS AND CAD TECHNIQUE: Bilateral screening digital craniocaudal and mediolateral oblique mammograms were obtained. Bilateral screening digital breast tomosynthesis was performed. The images were evaluated with computer-aided detection. COMPARISON:  Previous exam(s). ACR Breast Density Category c: The breast tissue is heterogeneously dense, which may obscure small masses. FINDINGS: There are no findings suspicious for malignancy. IMPRESSION: No mammographic evidence of malignancy. A result letter of this screening mammogram will be mailed directly to the patient. RECOMMENDATION: Screening mammogram in one year. (Code:SM-B-01Y) BI-RADS CATEGORY  1: Negative. Electronically Signed   By: Claudie Revering M.D.   On: 02/11/2022 17:02       Assessment & Plan:   Problem List Items Addressed This Visit     Hypercholesteremia    Low cholesterol diet and exercise.  Follow lipid panel.       Relevant Orders   Hepatic function panel (Completed)   Lipid panel (Completed)   Hypertension, essential - Primary    Blood pressure elevated on my check.  Increased stress.  Blood pressure has been doing better. Continue diovan/hctz. Hold on changes.  Follow pressures.  Follow metabolic panel. Send in readings.  Get her back in soon to reassess.       Relevant Orders   Basic metabolic panel (Completed)   Rash    Triamcinolone cream as directed.  Call with update.       Stress    Increased stress.  Discussed.  She feels she needs something to help level things out.  Start zoloft  as directed.  Follow.       Thyroid nodule     S/p thyroid biopsy - benign (evaluated 01/2022).  Recommended f/u thyroid ultrasound and appt in one year.      Relevant Orders   TSH (Completed)     Einar Pheasant, MD

## 2022-05-27 ENCOUNTER — Encounter: Payer: Self-pay | Admitting: Internal Medicine

## 2022-05-27 DIAGNOSIS — R21 Rash and other nonspecific skin eruption: Secondary | ICD-10-CM | POA: Insufficient documentation

## 2022-05-27 NOTE — Assessment & Plan Note (Signed)
Low cholesterol diet and exercise.  Follow lipid panel.   

## 2022-05-27 NOTE — Assessment & Plan Note (Signed)
Increased stress.  Discussed.  She feels she needs something to help level things out.  Start zoloft as directed.  Follow.

## 2022-05-27 NOTE — Assessment & Plan Note (Signed)
Triamcinolone cream as directed.  Call with update.

## 2022-06-16 ENCOUNTER — Other Ambulatory Visit: Payer: Self-pay | Admitting: Internal Medicine

## 2022-07-06 ENCOUNTER — Encounter: Payer: Self-pay | Admitting: Internal Medicine

## 2022-07-06 ENCOUNTER — Telehealth (INDEPENDENT_AMBULATORY_CARE_PROVIDER_SITE_OTHER): Payer: No Typology Code available for payment source | Admitting: Internal Medicine

## 2022-07-06 VITALS — Ht 69.0 in

## 2022-07-06 DIAGNOSIS — E7800 Pure hypercholesterolemia, unspecified: Secondary | ICD-10-CM

## 2022-07-06 DIAGNOSIS — U071 COVID-19: Secondary | ICD-10-CM | POA: Diagnosis not present

## 2022-07-06 DIAGNOSIS — F439 Reaction to severe stress, unspecified: Secondary | ICD-10-CM

## 2022-07-06 DIAGNOSIS — E041 Nontoxic single thyroid nodule: Secondary | ICD-10-CM

## 2022-07-06 DIAGNOSIS — Z1211 Encounter for screening for malignant neoplasm of colon: Secondary | ICD-10-CM

## 2022-07-06 DIAGNOSIS — I10 Essential (primary) hypertension: Secondary | ICD-10-CM | POA: Diagnosis not present

## 2022-07-06 MED ORDER — SERTRALINE HCL 50 MG PO TABS: ORAL_TABLET | ORAL | 1 refills | Status: DC

## 2022-07-06 NOTE — Progress Notes (Signed)
Patient ID: Cindy Martin, female   DOB: [birth year], 51 y.o.   MRN: [id number]   Virtual Visit via video Note  All issues noted in this document were discussed and addressed.  No physical exam was performed (except for noted visual exam findings with Video Visits).   I connected with Mechele Collin by a video enabled telemedicine application and verified that I am speaking with the correct person using two identifiers. Location patient: home Location provider: work  Persons participating in the virtual visit: patient, provider  The limitations, risks, security and privacy concerns of performing an evaluation and management service by video and the availability of in person appointments have been discussed.  It has also been discussed with the patient that there may be a patient responsible charge related to this service. The patient expressed understanding and agreed to proceed.   Reason for visit: follow up appt.   HPI: Follow up regarding her blood pressure and increased stress.  Diagnosed with covid - visit - virtual.  Symptoms started 06/30/22.  Started with headache.  Then developed vomiting and diarrhea.  This lasted until Saturday 07/04/22.  Feeling better now.  No headache.  No vomiting or diarrhea now.  Ate yesterday with no problems.  No abdominal pain.  No increased cough, congestion or sob reported.  Was started on zoloft last visit.  Feels is helping.  Does feel helping with anxiety during the day, but feels may "wear off" as the day progresses.  Discussed increasing dose.     ROS: See pertinent positives and negatives per HPI.  Past Medical History:  Diagnosis Date   Chicken pox    Hypertension    Menorrhagia     Past Surgical History:  Procedure Laterality Date   ABDOMINAL HYSTERECTOMY     COLONOSCOPY WITH PROPOFOL N/A 10/24/2021   Procedure: COLONOSCOPY WITH PROPOFOL;  Surgeon: Jonathon Bellows, MD;  Location: [facility] ENDOSCOPY;  Service: Gastroenterology;  Laterality: N/A;    CYSTOSCOPY N/A 01/30/2020   Procedure: CYSTOSCOPY;  Surgeon: Gae Dry, MD;  Location: [facility] ORS;  Service: Gynecology;  Laterality: N/A;   TOTAL LAPAROSCOPIC HYSTERECTOMY WITH SALPINGECTOMY Bilateral 01/30/2020   Procedure: TOTAL LAPAROSCOPIC HYSTERECTOMY WITH RIGHT SALPINGECTOMY;  Surgeon: Gae Dry, MD;  Location: [facility] ORS;  Service: Gynecology;  Laterality: Bilateral;   TUBAL LIGATION     Tube reversal      Family History  Problem Relation Age of Onset   Stroke Mother    Hypertension Mother    Arthritis Mother    Hypertension Sister    Asthma Daughter    Breast cancer Paternal Grandmother    Ovarian cancer Neg Hx     SOCIAL HX: reviewed.    Current Outpatient Medications:    aspirin 81 MG EC tablet, Take 1 tablet (81 mg total) by mouth daily. Swallow whole., Disp: 30 tablet, Rfl: 0   valsartan-hydrochlorothiazide (DIOVAN-HCT) 80-12.5 MG tablet, Take 1 tablet by mouth daily., Disp: 90 tablet, Rfl: 1   sertraline (ZOLOFT) 50 MG tablet, TAKE 1 and 1/2 TABLET BY MOUTH EVERY DAY, Disp: 135 tablet, Rfl: 1  EXAM:  VITALS per patient if applicable:132/78  GENERAL: alert, oriented, appears well and in no acute distress  HEENT: atraumatic, conjunttiva clear, no obvious abnormalities on inspection of external nose and ears  NECK: normal movements of the head and neck  LUNGS: on inspection no signs of respiratory distress, breathing rate appears normal, no obvious gross SOB, gasping or wheezing  CV: no obvious cyanosis  PSYCH/NEURO: pleasant and cooperative, no obvious depression or anxiety, speech and thought processing grossly intact  ASSESSMENT AND PLAN:  Discussed the following assessment and plan:  Problem List Items Addressed This Visit     Colon cancer screening    Colonoscopy 10/2021 - recommended f/u in 3 years.       COVID-19 virus infection    Symptoms started 06/30/22 - as outlined.  Symptoms improved.  GI symptoms improved.  Ate yesterday.  No  problems. No cough or congestion.  No chest pain or sob.  Discussed rest.  Fluids.  Quarantine guidelines.  Follow.  Call with update.       Hypercholesteremia    Low cholesterol diet and exercise.  Follow lipid panel.       Hypertension, essential    Blood pressure as outlined.  Continue valsartan/hctz.  Follow pressures.  Follow metabolic panel.       Stress    Started on zoloft last visit.  Does feel is helping with anxiety, but feels may need to adjust dose.  Increase zoloft to 1 1/2 tablet q day.  Follow.        Thyroid nodule     S/p thyroid biopsy - benign (evaluated 01/2022).  Recommended f/u thyroid ultrasound and appt in one year.      Other Visit Diagnoses     COVID    -  Primary       Return in about 9 weeks (around 09/07/2022).   I discussed the assessment and treatment plan with the patient. The patient was provided an opportunity to ask questions and all were answered. The patient agreed with the plan and demonstrated an understanding of the instructions.   The patient was advised to call back or seek an in-person evaluation if the symptoms worsen or if the condition fails to improve as anticipated.   Einar Pheasant, MD

## 2022-07-17 ENCOUNTER — Encounter: Payer: Self-pay | Admitting: Internal Medicine

## 2022-07-17 DIAGNOSIS — U071 COVID-19: Secondary | ICD-10-CM | POA: Insufficient documentation

## 2022-07-17 NOTE — Assessment & Plan Note (Signed)
Low cholesterol diet and exercise.  Follow lipid panel.   

## 2022-07-17 NOTE — Assessment & Plan Note (Signed)
Symptoms started 06/30/22 - as outlined.  Symptoms improved.  GI symptoms improved.  Ate yesterday.  No problems. No cough or congestion.  No chest pain or sob.  Discussed rest.  Fluids.  Quarantine guidelines.  Follow.  Call with update.

## 2022-07-17 NOTE — Assessment & Plan Note (Signed)
Colonoscopy 10/2021 - recommended f/u in 3 years.  

## 2022-07-17 NOTE — Assessment & Plan Note (Signed)
Started on zoloft last visit.  Does feel is helping with anxiety, but feels may need to adjust dose.  Increase zoloft to 1 1/2 tablet q day.  Follow.

## 2022-07-17 NOTE — Assessment & Plan Note (Signed)
S/p thyroid biopsy - benign (evaluated 01/2022).  Recommended f/u thyroid ultrasound and appt in one year. 

## 2022-07-17 NOTE — Assessment & Plan Note (Signed)
Blood pressure as outlined.  Continue valsartan/hctz.  Follow pressures.  Follow metabolic panel.

## 2022-08-26 ENCOUNTER — Telehealth: Payer: Self-pay

## 2022-08-26 NOTE — Telephone Encounter (Signed)
Left voicemail for patient asking her to please call us to schedule her follow-up appointment from her MyChart Visit with Dr. Einar Pheasant on 07/06/2022.  Dr. Nicki Reaper would like to see her back in nine weeks.

## 2022-09-04 ENCOUNTER — Encounter: Payer: Self-pay | Admitting: Internal Medicine

## 2022-09-04 ENCOUNTER — Ambulatory Visit: Payer: No Typology Code available for payment source | Admitting: Internal Medicine

## 2022-09-04 VITALS — BP 128/78 | HR 81 | Temp 97.9°F | Resp 16 | Ht 69.0 in | Wt 238.0 lb

## 2022-09-04 DIAGNOSIS — I10 Essential (primary) hypertension: Secondary | ICD-10-CM

## 2022-09-04 DIAGNOSIS — Z1211 Encounter for screening for malignant neoplasm of colon: Secondary | ICD-10-CM

## 2022-09-04 DIAGNOSIS — R232 Flushing: Secondary | ICD-10-CM | POA: Diagnosis not present

## 2022-09-04 DIAGNOSIS — M2550 Pain in unspecified joint: Secondary | ICD-10-CM | POA: Diagnosis not present

## 2022-09-04 DIAGNOSIS — F439 Reaction to severe stress, unspecified: Secondary | ICD-10-CM

## 2022-09-04 DIAGNOSIS — E041 Nontoxic single thyroid nodule: Secondary | ICD-10-CM

## 2022-09-04 DIAGNOSIS — E7800 Pure hypercholesterolemia, unspecified: Secondary | ICD-10-CM

## 2022-09-04 LAB — CBC WITH DIFFERENTIAL/PLATELET
Basophils Absolute: 0.1 10*3/uL (ref 0.0–0.1)
Basophils Relative: 1 % (ref 0.0–3.0)
Eosinophils Absolute: 0.4 10*3/uL (ref 0.0–0.7)
Eosinophils Relative: 6.2 % — ABNORMAL HIGH (ref 0.0–5.0)
HCT: 44 % (ref 36.0–46.0)
Hemoglobin: 15.2 g/dL — ABNORMAL HIGH (ref 12.0–15.0)
Lymphocytes Relative: 19.4 % (ref 12.0–46.0)
Lymphs Abs: 1.2 10*3/uL (ref 0.7–4.0)
MCHC: 34.5 g/dL (ref 30.0–36.0)
MCV: 92.5 fl (ref 78.0–100.0)
Monocytes Absolute: 0.6 10*3/uL (ref 0.1–1.0)
Monocytes Relative: 9.4 % (ref 3.0–12.0)
Neutro Abs: 4 10*3/uL (ref 1.4–7.7)
Neutrophils Relative %: 64 % (ref 43.0–77.0)
Platelets: 230 10*3/uL (ref 150.0–400.0)
RBC: 4.76 Mil/uL (ref 3.87–5.11)
RDW: 13.5 % (ref 11.5–15.5)
WBC: 6.2 10*3/uL (ref 4.0–10.5)

## 2022-09-04 LAB — LIPID PANEL
Cholesterol: 223 mg/dL — ABNORMAL HIGH (ref 0–200)
HDL: 49.2 mg/dL (ref >39.00)
LDL Cholesterol: 136 mg/dL — ABNORMAL HIGH (ref 0–99)
NonHDL: 173.82
Total CHOL/HDL Ratio: 5
Triglycerides: 191 mg/dL — ABNORMAL HIGH (ref 0.0–149.0)
VLDL: 38.2 mg/dL (ref 0.0–40.0)

## 2022-09-04 LAB — TSH: TSH: 0.88 u[IU]/mL (ref 0.35–5.50)

## 2022-09-04 LAB — HEPATIC FUNCTION PANEL
ALT: 26 U/L (ref 0–35)
AST: 20 U/L (ref 0–37)
Albumin: 4.3 g/dL (ref 3.5–5.2)
Alkaline Phosphatase: 73 U/L (ref 39–117)
Bilirubin, Direct: 0.1 mg/dL (ref 0.0–0.3)
Total Bilirubin: 0.5 mg/dL (ref 0.2–1.2)
Total Protein: 6.7 g/dL (ref 6.0–8.3)

## 2022-09-04 LAB — BASIC METABOLIC PANEL
BUN: 24 mg/dL — ABNORMAL HIGH (ref 6–23)
CO2: 27 mEq/L (ref 19–32)
Calcium: 9.4 mg/dL (ref 8.4–10.5)
Chloride: 103 mEq/L (ref 96–112)
Creatinine, Ser: 0.96 mg/dL (ref 0.40–1.20)
GFR: 69.58 mL/min (ref >60.00)
Glucose, Bld: 96 mg/dL (ref 70–99)
Potassium: 3.7 mEq/L (ref 3.5–5.1)
Sodium: 140 mEq/L (ref 135–145)

## 2022-09-04 LAB — SEDIMENTATION RATE: Sed Rate: 9 mm/hr (ref 0–20)

## 2022-09-04 LAB — FOLLICLE STIMULATING HORMONE: FSH: 5.2 m[IU]/mL

## 2022-09-04 NOTE — Progress Notes (Unsigned)
Subjective:    Patient ID: Cindy Martin, female    DOB: 1971-03-11, 52 y.o.   MRN: [id number]  Patient here for  Chief Complaint  Patient presents with   Medical Management of Chronic Issues    HPI Follow up regarding her blood pressure and increased stress.  On zoloft. Increased to 47m last visit.  Has been working well.  She has noticed recently - increased hot flashes and night sweats.  Also mood swings and aching - knees, hips and back.  Aching worse if has been sitting or lying for a while and then gets up.  Once moving is better.  Concerned regarding possible menopause.  No chest pain or sob.  No cough or congestion.  No acid reflux.  No abdominal pain.  Bowels moving.  Handling stress.     Past Medical History:  Diagnosis Date   Chicken pox    Hypertension    Menorrhagia    Past Surgical History:  Procedure Laterality Date   ABDOMINAL HYSTERECTOMY     COLONOSCOPY WITH PROPOFOL N/A 10/24/2021   Procedure: COLONOSCOPY WITH PROPOFOL;  Surgeon: AJonathon Bellows MD;  [facility];  Service: Gastroenterology;  Laterality: N/A;   CYSTOSCOPY N/A 01/30/2020   Procedure: CYSTOSCOPY;  Surgeon: HGae Dry MD;  Location: [facility] ORS;  Service: Gynecology;  Laterality: N/A;   TOTAL LAPAROSCOPIC HYSTERECTOMY WITH SALPINGECTOMY Bilateral 01/30/2020   Procedure: TOTAL LAPAROSCOPIC HYSTERECTOMY WITH RIGHT SALPINGECTOMY;  Surgeon: HGae Dry MD;  Location: [facility] ORS;  Service: Gynecology;  Laterality: Bilateral;   TUBAL LIGATION     Tube reversal     Family History  Problem Relation Age of Onset   Stroke Mother    Hypertension Mother    Arthritis Mother    Hypertension Sister    Asthma Daughter    Breast cancer Paternal Grandmother    Ovarian cancer Neg Hx    Social History   Socioeconomic History   Marital status: Married    Spouse name: Not on file   Number of children: Not on file   Years of education: Not on file   Highest education level: Not on file   Occupational History   Occupation: Preschool Teacher  Tobacco Use   Smoking status: Never   Smokeless tobacco: Never  Vaping Use   Vaping Use: Never used  Substance and Sexual Activity   Alcohol use: No   Drug use: No   Sexual activity: Yes    Birth control/protection: Surgical    Comment: Tubal ligation/reversed  Other Topics Concern   Not on file  Social History Narrative   Not on file   Social Determinants of Health   Financial Resource Strain: Not on file  Food Insecurity: Not on file  Transportation Needs: Not on file  Physical Activity: Not on file  Stress: Not on file  Social Connections: Not on file     Review of Systems     Objective:     BP 128/78   Pulse 81   Temp 97.9 F (36.6 C)   Resp 16   Ht 5' 9"$  (1.753 m)   Wt 238 lb (108 kg)   LMP 01/02/2020 (Exact Date)   SpO2 98%   BMI 35.15 kg/m  Wt Readings from Last 3 Encounters:  09/04/22 238 lb (108 kg)  05/25/22 238 lb 9.6 oz (108.2 kg)  01/22/22 240 lb (108.9 kg)    Physical Exam   Outpatient Encounter Medications as of 09/04/2022  Medication Sig  Magnesium 250 MG TABS Take 250 mg by mouth daily.   aspirin 81 MG EC tablet Take 1 tablet (81 mg total) by mouth daily. Swallow whole.   sertraline (ZOLOFT) 50 MG tablet TAKE 1 and 1/2 TABLET BY MOUTH EVERY DAY   valsartan-hydrochlorothiazide (DIOVAN-HCT) 80-12.5 MG tablet Take 1 tablet by mouth daily.   No facility-administered encounter medications on file as of 09/04/2022.     Lab Results  Component Value Date   WBC 6.8 09/05/2021   HGB 14.9 09/05/2021   HCT 45.2 09/05/2021   PLT 232.0 09/05/2021   GLUCOSE 95 05/25/2022   CHOL 184 05/25/2022   TRIG 179.0 (H) 05/25/2022   HDL 51.00 05/25/2022   LDLCALC 97 05/25/2022   ALT 20 05/25/2022   AST 17 05/25/2022   NA 140 05/25/2022   K 3.9 05/25/2022   CL 105 05/25/2022   CREATININE 0.93 05/25/2022   BUN 25 (H) 05/25/2022   CO2 25 05/25/2022   TSH 1.48 05/25/2022   HGBA1C 5.6  12/12/2019    MM 3D SCREEN BREAST BILATERAL  Result Date: 02/11/2022 CLINICAL DATA:  Screening. EXAM: DIGITAL SCREENING BILATERAL MAMMOGRAM WITH TOMOSYNTHESIS AND CAD TECHNIQUE: Bilateral screening digital craniocaudal and mediolateral oblique mammograms were obtained. Bilateral screening digital breast tomosynthesis was performed. The images were evaluated with computer-aided detection. COMPARISON:  Previous exam(s). ACR Breast Density Category c: The breast tissue is heterogeneously dense, which may obscure small masses. FINDINGS: There are no findings suspicious for malignancy. IMPRESSION: No mammographic evidence of malignancy. A result letter of this screening mammogram will be mailed directly to the patient. RECOMMENDATION: Screening mammogram in one year. (Code:SM-B-01Y) BI-RADS CATEGORY  1: Negative. Electronically Signed   By: Claudie Revering M.D.   On: 02/11/2022 17:02       Assessment & Plan:  Hypercholesteremia -     Lipid panel -     Hepatic function panel  Hypertension, essential -     CBC with Differential/Platelet -     Basic metabolic panel  Arthralgia, unspecified joint -     Sedimentation rate  Hot flashes -     Follicle stimulating hormone -     TSH     Einar Pheasant, MD

## 2022-09-05 ENCOUNTER — Encounter: Payer: Self-pay | Admitting: Internal Medicine

## 2022-09-05 NOTE — Assessment & Plan Note (Signed)
On Zoloft 75 mg/day now.  Overall feels she is doing well on this dose.  Will hold on adjusting.  Continue Zoloft.  Follow.

## 2022-09-05 NOTE — Assessment & Plan Note (Signed)
Blood pressure as outlined.  Overall improved.  Continue valsartan/hctz.  Follow pressures.  Follow metabolic panel.

## 2022-09-05 NOTE — Assessment & Plan Note (Signed)
Symptoms as outlined.  Concerned regarding possible menopause.  Is status post hysterectomy.  Would like further lab testing.

## 2022-09-05 NOTE — Assessment & Plan Note (Signed)
Low cholesterol diet and exercise.  Follow lipid panel.   

## 2022-09-05 NOTE — Assessment & Plan Note (Signed)
S/p thyroid biopsy - benign (evaluated 01/2022).  Recommended f/u thyroid ultrasound and appt in one year.

## 2022-09-05 NOTE — Assessment & Plan Note (Signed)
Colonoscopy 10/2021 - recommended f/u in 3 years.

## 2022-09-05 NOTE — Assessment & Plan Note (Signed)
Reports that she has had aching in her knees, hips and back.  Worse after she has been lying or sitting for a while.  When she is standing and moving around, symptoms improve.  Discussed possible arthritis.  Discussed exercise and stretches.  Will check sed rate.

## 2022-10-02 ENCOUNTER — Encounter: Payer: Self-pay | Admitting: Internal Medicine

## 2022-10-02 NOTE — Telephone Encounter (Signed)
Patient should be scheduled for an appointment to evaluate this. If she is having associated chest pain or shortness of breath she needs to go to the ED.

## 2022-10-05 NOTE — Telephone Encounter (Signed)
Called pt and she denies chest pain or shortness of breath.  Scheduled an appointment with Dr. Nicki Reaper on 10/08/22 @ 7am. Pt is aware to go to ED for chest pain, SOB, HA. Pt verbalizes understanding.

## 2022-10-08 ENCOUNTER — Encounter: Payer: Self-pay | Admitting: Internal Medicine

## 2022-10-08 ENCOUNTER — Ambulatory Visit: Payer: No Typology Code available for payment source | Admitting: Internal Medicine

## 2022-10-08 VITALS — BP 136/88 | HR 80 | Temp 98.0°F | Resp 16 | Ht 69.0 in | Wt 240.0 lb

## 2022-10-08 DIAGNOSIS — E7800 Pure hypercholesterolemia, unspecified: Secondary | ICD-10-CM

## 2022-10-08 DIAGNOSIS — I10 Essential (primary) hypertension: Secondary | ICD-10-CM | POA: Diagnosis not present

## 2022-10-08 DIAGNOSIS — R519 Headache, unspecified: Secondary | ICD-10-CM | POA: Diagnosis not present

## 2022-10-08 MED ORDER — VALSARTAN-HYDROCHLOROTHIAZIDE 160-12.5 MG PO TABS: 1.0000 | ORAL_TABLET | Freq: Every day | ORAL | 1 refills | Status: DC

## 2022-10-08 NOTE — Progress Notes (Signed)
Subjective:    Patient ID: Cindy Martin, female    DOB: 1971-04-22, 52 y.o.   MRN: [id number]  Patient here for  Chief Complaint  Patient presents with   Medical Management of Chronic Issues    HPI Here as a work in for elevated blood pressure. Called in - blood pressures 130-140s/80-90s.  On valsartan/hctz 80/12.5. has started having headaches - same as when blood pressure was elevated previously.  No unusual headache.  No dizziness or light headedness.  No vision change.  No chest pain or sob.  No change - with stress. Feels handling things well.  Has adjusted her diet - given recent cholesterol numbers.  Discussed cholesterol - triglycerides 191 and LDL in 130s.     Past Medical History:  Diagnosis Date   Chicken pox    Hypertension    Menorrhagia    Past Surgical History:  Procedure Laterality Date   ABDOMINAL HYSTERECTOMY     COLONOSCOPY WITH PROPOFOL N/A 10/24/2021   Procedure: COLONOSCOPY WITH PROPOFOL;  Surgeon: Jonathon Bellows, MD;  Location: [facility] ENDOSCOPY;  Service: Gastroenterology;  Laterality: N/A;   CYSTOSCOPY N/A 01/30/2020   Procedure: CYSTOSCOPY;  Surgeon: Gae Dry, MD;  Location: [facility] ORS;  Service: Gynecology;  Laterality: N/A;   TOTAL LAPAROSCOPIC HYSTERECTOMY WITH SALPINGECTOMY Bilateral 01/30/2020   Procedure: TOTAL LAPAROSCOPIC HYSTERECTOMY WITH RIGHT SALPINGECTOMY;  Surgeon: Gae Dry, MD;  Location: [facility] ORS;  Service: Gynecology;  Laterality: Bilateral;   TUBAL LIGATION     Tube reversal     Family History  Problem Relation Age of Onset   Stroke Mother    Hypertension Mother    Arthritis Mother    Hypertension Sister    Asthma Daughter    Breast cancer Paternal Grandmother    Ovarian cancer Neg Hx    Social History   Socioeconomic History   Marital status: Married    Spouse name: Not on file   Number of children: Not on file   Years of education: Not on file   Highest education level: Not on file  Occupational History    Occupation: Preschool Teacher  Tobacco Use   Smoking status: Never   Smokeless tobacco: Never  Vaping Use   Vaping Use: Never used  Substance and Sexual Activity   Alcohol use: No   Drug use: No   Sexual activity: Yes    Birth control/protection: Surgical    Comment: Tubal ligation/reversed  Other Topics Concern   Not on file  Social History Narrative   Not on file   Social Determinants of Health   Financial Resource Strain: Not on file  Food Insecurity: Not on file  Transportation Needs: Not on file  Physical Activity: Not on file  Stress: Not on file  Social Connections: Not on file     Review of Systems  Constitutional:  Negative for appetite change and unexpected weight change.  HENT:  Negative for congestion and sinus pressure.   Respiratory:  Negative for cough, chest tightness and shortness of breath.   Cardiovascular:  Negative for chest pain, palpitations and leg swelling.  Gastrointestinal:  Negative for abdominal pain, diarrhea, nausea and vomiting.  Genitourinary:  Negative for difficulty urinating and dysuria.  Musculoskeletal:  Negative for joint swelling and myalgias.  Skin:  Negative for color change and rash.  Neurological:  Positive for headaches. Negative for dizziness.  Psychiatric/Behavioral:  Negative for agitation and dysphoric mood.        Objective:  BP (!) 142/82   Pulse 80   Temp 98 F (36.7 C)   Resp 16   Ht 5\' 9"  (1.753 m)   Wt 240 lb (108.9 kg)   LMP 01/02/2020 (Exact Date)   SpO2 98%   BMI 35.44 kg/m  Wt Readings from Last 3 Encounters:  10/08/22 240 lb (108.9 kg)  09/04/22 238 lb (108 kg)  05/25/22 238 lb 9.6 oz (108.2 kg)    Physical Exam Vitals reviewed.  Constitutional:      General: She is not in acute distress.    Appearance: Normal appearance.  HENT:     Head: Normocephalic and atraumatic.     Right Ear: External ear normal.     Left Ear: External ear normal.  Eyes:     General: No scleral icterus.        Right eye: No discharge.        Left eye: No discharge.     Conjunctiva/sclera: Conjunctivae normal.  Neck:     Thyroid: No thyromegaly.  Cardiovascular:     Rate and Rhythm: Normal rate and regular rhythm.  Pulmonary:     Effort: No respiratory distress.     Breath sounds: Normal breath sounds. No wheezing.  Abdominal:     General: Bowel sounds are normal.     Palpations: Abdomen is soft.     Tenderness: There is no abdominal tenderness.  Musculoskeletal:        General: No swelling or tenderness.     Cervical back: Neck supple. No tenderness.  Lymphadenopathy:     Cervical: No cervical adenopathy.  Skin:    Findings: No erythema or rash.  Neurological:     Mental Status: She is alert.  Psychiatric:        Mood and Affect: Mood normal.        Behavior: Behavior normal.      Outpatient Encounter Medications as of 10/08/2022  Medication Sig   valsartan-hydrochlorothiazide (DIOVAN-HCT) 160-12.5 MG tablet Take 1 tablet by mouth daily.   aspirin 81 MG EC tablet Take 1 tablet (81 mg total) by mouth daily. Swallow whole.   Magnesium 250 MG TABS Take 250 mg by mouth daily.   sertraline (ZOLOFT) 50 MG tablet TAKE 1 and 1/2 TABLET BY MOUTH EVERY DAY   [DISCONTINUED] valsartan-hydrochlorothiazide (DIOVAN-HCT) 80-12.5 MG tablet Take 1 tablet by mouth daily.   No facility-administered encounter medications on file as of 10/08/2022.     Lab Results  Component Value Date   WBC 6.2 09/04/2022   HGB 15.2 (H) 09/04/2022   HCT 44.0 09/04/2022   PLT 230.0 09/04/2022   GLUCOSE 96 09/04/2022   CHOL 223 (H) 09/04/2022   TRIG 191.0 (H) 09/04/2022   HDL 49.20 09/04/2022   LDLCALC 136 (H) 09/04/2022   ALT 26 09/04/2022   AST 20 09/04/2022   NA 140 09/04/2022   K 3.7 09/04/2022   CL 103 09/04/2022   CREATININE 0.96 09/04/2022   BUN 24 (H) 09/04/2022   CO2 27 09/04/2022   TSH 0.88 09/04/2022   HGBA1C 5.6 12/12/2019    MM 3D SCREEN BREAST BILATERAL  Result Date:  02/11/2022 CLINICAL DATA:  Screening. EXAM: DIGITAL SCREENING BILATERAL MAMMOGRAM WITH TOMOSYNTHESIS AND CAD TECHNIQUE: Bilateral screening digital craniocaudal and mediolateral oblique mammograms were obtained. Bilateral screening digital breast tomosynthesis was performed. The images were evaluated with computer-aided detection. COMPARISON:  Previous exam(s). ACR Breast Density Category c: The breast tissue is heterogeneously dense, which may obscure small masses. FINDINGS:  There are no findings suspicious for malignancy. IMPRESSION: No mammographic evidence of malignancy. A result letter of this screening mammogram will be mailed directly to the patient. RECOMMENDATION: Screening mammogram in one year. (Code:SM-B-01Y) BI-RADS CATEGORY  1: Negative. Electronically Signed   By: Claudie Revering M.D.   On: 02/11/2022 17:02       Assessment & Plan:  Hypertension, essential Assessment & Plan: Blood pressure as outlined. Elevated above goal.  Change valsartan/hctz 80/12.5 to 160/12.5.   Follow pressures.  Follow metabolic panel.    Hypercholesteremia Assessment & Plan: Low cholesterol diet and exercise.  Discussed.  She has adjusted her diet. Follow lipid panel.    Nonintractable headache, unspecified chronicity pattern, unspecified headache type Assessment & Plan: No headache today.  Similar headache to previous.  Change blood pressure medication as outlined.  Follow.  Call with update.    Other orders -     Valsartan-hydroCHLOROthiazide; Take 1 tablet by mouth daily.  Dispense: 90 tablet; Refill: 1     Einar Pheasant, MD

## 2022-10-08 NOTE — Assessment & Plan Note (Signed)
Blood pressure as outlined. Elevated above goal.  Change valsartan/hctz 80/12.5 to 160/12.5.   Follow pressures.  Follow metabolic panel.

## 2022-10-08 NOTE — Assessment & Plan Note (Signed)
No headache today.  Similar headache to previous.  Change blood pressure medication as outlined.  Follow.  Call with update.

## 2022-10-08 NOTE — Assessment & Plan Note (Signed)
Low cholesterol diet and exercise.  Discussed.  She has adjusted her diet. Follow lipid panel.

## 2022-11-14 ENCOUNTER — Other Ambulatory Visit: Payer: Self-pay | Admitting: Internal Medicine

## 2022-12-04 ENCOUNTER — Ambulatory Visit: Payer: No Typology Code available for payment source | Admitting: Internal Medicine

## 2022-12-04 ENCOUNTER — Encounter: Payer: Self-pay | Admitting: Internal Medicine

## 2022-12-04 VITALS — BP 128/72 | HR 70 | Temp 98.0°F | Resp 16 | Ht 69.0 in | Wt 236.0 lb

## 2022-12-04 DIAGNOSIS — E041 Nontoxic single thyroid nodule: Secondary | ICD-10-CM

## 2022-12-04 DIAGNOSIS — R519 Headache, unspecified: Secondary | ICD-10-CM | POA: Diagnosis not present

## 2022-12-04 DIAGNOSIS — Z1211 Encounter for screening for malignant neoplasm of colon: Secondary | ICD-10-CM | POA: Diagnosis not present

## 2022-12-04 DIAGNOSIS — F439 Reaction to severe stress, unspecified: Secondary | ICD-10-CM

## 2022-12-04 DIAGNOSIS — E7800 Pure hypercholesterolemia, unspecified: Secondary | ICD-10-CM | POA: Diagnosis not present

## 2022-12-04 DIAGNOSIS — I10 Essential (primary) hypertension: Secondary | ICD-10-CM

## 2022-12-04 LAB — HEPATIC FUNCTION PANEL
ALT: 48 U/L — ABNORMAL HIGH (ref 0–35)
AST: 31 U/L (ref 0–37)
Albumin: 4.3 g/dL (ref 3.5–5.2)
Alkaline Phosphatase: 58 U/L (ref 39–117)
Bilirubin, Direct: 0.1 mg/dL (ref 0.0–0.3)
Total Bilirubin: 0.5 mg/dL (ref 0.2–1.2)
Total Protein: 6.8 g/dL (ref 6.0–8.3)

## 2022-12-04 LAB — CBC WITH DIFFERENTIAL/PLATELET
Basophils Absolute: 0 10*3/uL (ref 0.0–0.1)
Basophils Relative: 0.7 % (ref 0.0–3.0)
Eosinophils Absolute: 0.1 10*3/uL (ref 0.0–0.7)
Eosinophils Relative: 3 % (ref 0.0–5.0)
HCT: 44.9 % (ref 36.0–46.0)
Hemoglobin: 15.2 g/dL — ABNORMAL HIGH (ref 12.0–15.0)
Lymphocytes Relative: 20.5 % (ref 12.0–46.0)
Lymphs Abs: 1 10*3/uL (ref 0.7–4.0)
MCHC: 33.8 g/dL (ref 30.0–36.0)
MCV: 92.1 fl (ref 78.0–100.0)
Monocytes Absolute: 0.5 10*3/uL (ref 0.1–1.0)
Monocytes Relative: 11.3 % (ref 3.0–12.0)
Neutro Abs: 3 10*3/uL (ref 1.4–7.7)
Neutrophils Relative %: 64.5 % (ref 43.0–77.0)
Platelets: 252 10*3/uL (ref 150.0–400.0)
RBC: 4.87 Mil/uL (ref 3.87–5.11)
RDW: 13.8 % (ref 11.5–15.5)
WBC: 4.6 10*3/uL (ref 4.0–10.5)

## 2022-12-04 LAB — BASIC METABOLIC PANEL
BUN: 19 mg/dL (ref 6–23)
CO2: 27 mEq/L (ref 19–32)
Calcium: 9.3 mg/dL (ref 8.4–10.5)
Chloride: 105 mEq/L (ref 96–112)
Creatinine, Ser: 0.93 mg/dL (ref 0.40–1.20)
GFR: 72.15 mL/min (ref >60.00)
Glucose, Bld: 97 mg/dL (ref 70–99)
Potassium: 3.6 mEq/L (ref 3.5–5.1)
Sodium: 139 mEq/L (ref 135–145)

## 2022-12-04 LAB — LIPID PANEL
Cholesterol: 219 mg/dL — ABNORMAL HIGH (ref 0–200)
HDL: 44.8 mg/dL (ref >39.00)
LDL Cholesterol: 144 mg/dL — ABNORMAL HIGH (ref 0–99)
NonHDL: 174.55
Total CHOL/HDL Ratio: 5
Triglycerides: 154 mg/dL — ABNORMAL HIGH (ref 0.0–149.0)
VLDL: 30.8 mg/dL (ref 0.0–40.0)

## 2022-12-04 MED ORDER — SERTRALINE HCL 100 MG PO TABS: 100.0000 mg | ORAL_TABLET | Freq: Every day | ORAL | 3 refills | Status: DC

## 2022-12-04 NOTE — Progress Notes (Unsigned)
Subjective:    Patient ID: Cindy Martin, female    DOB: February 09, 1971, 52 y.o.   MRN: [id number]  Patient here for  Chief Complaint  Patient presents with   Medical Management of Chronic Issues    HPI Here for scheduled follow up - f/u hypertension and hypercholesterolemia.  Valsartan/hctz - increased to 160/12.5 last visit.  She is tolerating the medication.  Blood pressures have been doing better.  Had a couple of occasions where systolic blood pressure was 111.  Noticed headache (left side -typical) when occurred.  Overall doing well. No headache.  No dizziness or light headedness reported.  No chest pain or sob reported.  No abdominal pain or bowel change reported.  On zoloft.  Has been doing well on the zoloft.  Some increased stress.  Would like to increase the dose.     Past Medical History:  Diagnosis Date   Chicken pox    Hypertension    Menorrhagia    Past Surgical History:  Procedure Laterality Date   ABDOMINAL HYSTERECTOMY     COLONOSCOPY WITH PROPOFOL N/A 10/24/2021   Procedure: COLONOSCOPY WITH PROPOFOL;  Surgeon: Wyline Mood, MD;  Location: [place] ENDOSCOPY;  Service: Gastroenterology;  Laterality: N/A;   CYSTOSCOPY N/A 01/30/2020   Procedure: CYSTOSCOPY;  Surgeon: Nadara Mustard, MD;  Location: [facility] ORS;  Service: Gynecology;  Laterality: N/A;   TOTAL LAPAROSCOPIC HYSTERECTOMY WITH SALPINGECTOMY Bilateral 01/30/2020   Procedure: TOTAL LAPAROSCOPIC HYSTERECTOMY WITH RIGHT SALPINGECTOMY;  Surgeon: Nadara Mustard, MD;  Location: [facility] ORS;  Service: Gynecology;  Laterality: Bilateral;   TUBAL LIGATION     Tube reversal     Family History  Problem Relation Age of Onset   Stroke Mother    Hypertension Mother    Arthritis Mother    Hypertension Sister    Asthma Daughter    Breast cancer Paternal Grandmother    Ovarian cancer Neg Hx    Social History   Socioeconomic History   Marital status: Married    Spouse name: Not on file   Number of children: Not on  file   Years of education: Not on file   Highest education level: Not on file  Occupational History   Occupation: Preschool Teacher  Tobacco Use   Smoking status: Never   Smokeless tobacco: Never  Vaping Use   Vaping Use: Never used  Substance and Sexual Activity   Alcohol use: No   Drug use: No   Sexual activity: Yes    Birth control/protection: Surgical    Comment: Tubal ligation/reversed  Other Topics Concern   Not on file  Social History Narrative   Not on file   Social Determinants of Health   Financial Resource Strain: Not on file  Food Insecurity: Not on file  Transportation Needs: Not on file  Physical Activity: Not on file  Stress: Not on file  Social Connections: Not on file     Review of Systems  Constitutional:  Negative for appetite change and unexpected weight change.  HENT:  Negative for congestion and sinus pressure.   Respiratory:  Negative for cough, chest tightness and shortness of breath.   Cardiovascular:  Negative for chest pain, palpitations and leg swelling.  Gastrointestinal:  Negative for abdominal pain, diarrhea, nausea and vomiting.  Genitourinary:  Negative for difficulty urinating and dysuria.  Musculoskeletal:  Negative for joint swelling and myalgias.  Skin:  Negative for color change and rash.  Neurological:  Negative for dizziness and headaches.  Psychiatric/Behavioral:  Negative for agitation and dysphoric mood.        Objective:     BP 128/72   Pulse 70   Temp 98 F (36.7 C)   Resp 16   Ht 5\' 9"  (1.753 m)   Wt 236 lb (107 kg)   LMP 01/02/2020 (Exact Date)   SpO2 98%   BMI 34.85 kg/m  Wt Readings from Last 3 Encounters:  12/04/22 236 lb (107 kg)  10/08/22 240 lb (108.9 kg)  09/04/22 238 lb (108 kg)    Physical Exam Vitals reviewed.  Constitutional:      General: She is not in acute distress.    Appearance: Normal appearance.  HENT:     Head: Normocephalic and atraumatic.     Right Ear: External ear normal.      Left Ear: External ear normal.  Eyes:     General: No scleral icterus.       Right eye: No discharge.        Left eye: No discharge.     Conjunctiva/sclera: Conjunctivae normal.  Neck:     Thyroid: No thyromegaly.  Cardiovascular:     Rate and Rhythm: Normal rate and regular rhythm.  Pulmonary:     Effort: No respiratory distress.     Breath sounds: Normal breath sounds. No wheezing.  Abdominal:     General: Bowel sounds are normal.     Palpations: Abdomen is soft.     Tenderness: There is no abdominal tenderness.  Musculoskeletal:        General: No swelling or tenderness.     Cervical back: Neck supple. No tenderness.  Lymphadenopathy:     Cervical: No cervical adenopathy.  Skin:    Findings: No erythema or rash.  Neurological:     Mental Status: She is alert.  Psychiatric:        Mood and Affect: Mood normal.        Behavior: Behavior normal.      Outpatient Encounter Medications as of 12/04/2022  Medication Sig   aspirin 81 MG EC tablet Take 1 tablet (81 mg total) by mouth daily. Swallow whole.   Magnesium 250 MG TABS Take 250 mg by mouth daily.   sertraline (ZOLOFT) 50 MG tablet TAKE 1 and 1/2 TABLET BY MOUTH EVERY DAY   valsartan-hydrochlorothiazide (DIOVAN-HCT) 160-12.5 MG tablet Take 1 tablet by mouth daily.   No facility-administered encounter medications on file as of 12/04/2022.     Lab Results  Component Value Date   WBC 6.2 09/04/2022   HGB 15.2 (H) 09/04/2022   HCT 44.0 09/04/2022   PLT 230.0 09/04/2022   GLUCOSE 96 09/04/2022   CHOL 223 (H) 09/04/2022   TRIG 191.0 (H) 09/04/2022   HDL 49.20 09/04/2022   LDLCALC 136 (H) 09/04/2022   ALT 26 09/04/2022   AST 20 09/04/2022   NA 140 09/04/2022   K 3.7 09/04/2022   CL 103 09/04/2022   CREATININE 0.96 09/04/2022   BUN 24 (H) 09/04/2022   CO2 27 09/04/2022   TSH 0.88 09/04/2022   HGBA1C 5.6 12/12/2019    MM 3D SCREEN BREAST BILATERAL  Result Date: 02/11/2022 CLINICAL DATA:  Screening. EXAM:  DIGITAL SCREENING BILATERAL MAMMOGRAM WITH TOMOSYNTHESIS AND CAD TECHNIQUE: Bilateral screening digital craniocaudal and mediolateral oblique mammograms were obtained. Bilateral screening digital breast tomosynthesis was performed. The images were evaluated with computer-aided detection. COMPARISON:  Previous exam(s). ACR Breast Density Category c: The breast tissue is heterogeneously dense, which may obscure small masses.  FINDINGS: There are no findings suspicious for malignancy. IMPRESSION: No mammographic evidence of malignancy. A result letter of this screening mammogram will be mailed directly to the patient. RECOMMENDATION: Screening mammogram in one year. (Code:SM-B-01Y) BI-RADS CATEGORY  1: Negative. Electronically Signed   By: Beckie Salts M.D.   On: 02/11/2022 17:02       Assessment & Plan:  Hypertension, essential  Hypercholesteremia     Dale [place], MD

## 2022-12-07 ENCOUNTER — Encounter: Payer: Self-pay | Admitting: Internal Medicine

## 2022-12-07 ENCOUNTER — Telehealth: Payer: Self-pay

## 2022-12-07 DIAGNOSIS — E7800 Pure hypercholesterolemia, unspecified: Secondary | ICD-10-CM

## 2022-12-07 NOTE — Assessment & Plan Note (Signed)
Blood pressure as outlined. Continue valsartan/hctz 160/12.5.   Follow pressures.  Follow metabolic panel.

## 2022-12-07 NOTE — Telephone Encounter (Signed)
Pt called back and I read the message to her and she has scheduled a lab

## 2022-12-07 NOTE — Assessment & Plan Note (Signed)
No significant headache.  Overall doing better.  Blood pressure better.  Follow.

## 2022-12-07 NOTE — Assessment & Plan Note (Signed)
S/p thyroid biopsy - benign (evaluated 01/2022).  Recommended f/u thyroid ultrasound and appt in one year. 

## 2022-12-07 NOTE — Assessment & Plan Note (Signed)
On Zoloft 75 mg/day now.  Has done well on zoloft.  Some increased stress.  Desires to increase dose.  Increase to 100mg  q day. Follow.

## 2022-12-07 NOTE — Telephone Encounter (Signed)
-----   Message from Dale [place], MD sent at 12/07/2022  4:42 AM EDT ----- Notify - triglycerides improved some.  Cholesterol remains elevated.  Continue diet and exercise.  We will follow.  One liver test slightly increased.  Remainder of liver panel wnl.  Continue diet and exercise.  Recheck liver panel in 2 weeks.  Kidney function ok.

## 2022-12-07 NOTE — Assessment & Plan Note (Signed)
Colonoscopy 10/2021 - recommended f/u in 3 years.  

## 2022-12-07 NOTE — Telephone Encounter (Signed)
F/u liver panel ordered.  

## 2022-12-07 NOTE — Assessment & Plan Note (Signed)
Low cholesterol diet and exercise.  She has adjusted her diet.  Follow lipid panel.  °

## 2022-12-21 ENCOUNTER — Other Ambulatory Visit (INDEPENDENT_AMBULATORY_CARE_PROVIDER_SITE_OTHER): Payer: No Typology Code available for payment source

## 2022-12-21 DIAGNOSIS — E7800 Pure hypercholesterolemia, unspecified: Secondary | ICD-10-CM | POA: Diagnosis not present

## 2022-12-21 LAB — HEPATIC FUNCTION PANEL
ALT: 30 U/L (ref 0–35)
AST: 23 U/L (ref 0–37)
Albumin: 4.1 g/dL (ref 3.5–5.2)
Alkaline Phosphatase: 54 U/L (ref 39–117)
Bilirubin, Direct: 0.1 mg/dL (ref 0.0–0.3)
Total Bilirubin: 0.4 mg/dL (ref 0.2–1.2)
Total Protein: 6.6 g/dL (ref 6.0–8.3)

## 2023-01-07 ENCOUNTER — Encounter: Payer: Self-pay | Admitting: Internal Medicine

## 2023-01-07 ENCOUNTER — Ambulatory Visit: Payer: No Typology Code available for payment source | Admitting: Internal Medicine

## 2023-01-07 VITALS — BP 128/70 | HR 84 | Temp 97.9°F | Resp 16 | Ht 69.0 in | Wt 243.2 lb

## 2023-01-07 DIAGNOSIS — R21 Rash and other nonspecific skin eruption: Secondary | ICD-10-CM

## 2023-01-07 DIAGNOSIS — I10 Essential (primary) hypertension: Secondary | ICD-10-CM

## 2023-01-07 DIAGNOSIS — F439 Reaction to severe stress, unspecified: Secondary | ICD-10-CM

## 2023-01-07 MED ORDER — VALACYCLOVIR HCL 1 G PO TABS: 1000.0000 mg | ORAL_TABLET | Freq: Three times a day (TID) | ORAL | 0 refills | Status: AC

## 2023-01-07 NOTE — Progress Notes (Signed)
Subjective:    Patient ID: Cindy Martin, female    DOB: 08/25/1970, 52 y.o.   MRN: [id number]  Patient here for  Chief Complaint  Patient presents with   Herpes Zoster    HPI Work in appt - possible shingles.  Noticed (starting two days ago) - tingling/discomfort - right neck/ear and scalp.  Subsequently developed a rash.  Rash - neck.  Pain - ear.  No headache. No fever.  No cough or congestion.  Does report increased stress.  On zoloft.  Zoloft has been working well, but now with increased stress and anxiety.  Feels needs something more.     Past Medical History:  Diagnosis Date   Chicken pox    Hypertension    Menorrhagia    Past Surgical History:  Procedure Laterality Date   ABDOMINAL HYSTERECTOMY     COLONOSCOPY WITH PROPOFOL N/A 10/24/2021   Procedure: COLONOSCOPY WITH PROPOFOL;  Surgeon: Wyline Mood, MD;  Location: [facility] ENDOSCOPY;  Service: Gastroenterology;  Laterality: N/A;   CYSTOSCOPY N/A 01/30/2020   Procedure: CYSTOSCOPY;  Surgeon: Nadara Mustard, MD;  Location: [facility] ORS;  Service: Gynecology;  Laterality: N/A;   TOTAL LAPAROSCOPIC HYSTERECTOMY WITH SALPINGECTOMY Bilateral 01/30/2020   Procedure: TOTAL LAPAROSCOPIC HYSTERECTOMY WITH RIGHT SALPINGECTOMY;  Surgeon: Nadara Mustard, MD;  Location: [facility] ORS;  Service: Gynecology;  Laterality: Bilateral;   TUBAL LIGATION     Tube reversal     Family History  Problem Relation Age of Onset   Stroke Mother    Hypertension Mother    Arthritis Mother    Hypertension Sister    Asthma Daughter    Breast cancer Paternal Grandmother    Ovarian cancer Neg Hx    Social History   Socioeconomic History   Marital status: Married    Spouse name: Not on file   Number of children: Not on file   Years of education: Not on file   Highest education level: Not on file  Occupational History   Occupation: Preschool Teacher  Tobacco Use   Smoking status: Never   Smokeless tobacco: Never  Vaping Use   Vaping Use: Never  used  Substance and Sexual Activity   Alcohol use: No   Drug use: No   Sexual activity: Yes    Birth control/protection: Surgical    Comment: Tubal ligation/reversed  Other Topics Concern   Not on file  Social History Narrative   Not on file   Social Determinants of Health   Financial Resource Strain: Not on file  Food Insecurity: Not on file  Transportation Needs: Not on file  Physical Activity: Not on file  Stress: Not on file  Social Connections: Not on file     Review of Systems  Constitutional:  Negative for appetite change, fever and unexpected weight change.  HENT:  Negative for congestion and sinus pressure.   Respiratory:  Negative for cough, chest tightness and shortness of breath.   Cardiovascular:  Negative for chest pain, palpitations and leg swelling.  Gastrointestinal:  Negative for abdominal pain, diarrhea, nausea and vomiting.  Genitourinary:  Negative for difficulty urinating and dysuria.  Musculoskeletal:  Negative for joint swelling and myalgias.  Skin:  Positive for rash. Negative for color change.  Neurological:  Negative for dizziness and headaches.  Psychiatric/Behavioral:  Negative for agitation and dysphoric mood.        Increased stress and anxiety as outlined.        Objective:     BP 128/70  Pulse 84   Temp 97.9 F (36.6 C)   Resp 16   Ht 5\' 9"  (1.753 m)   Wt 243 lb 3.2 oz (110.3 kg)   LMP 01/02/2020 (Exact Date)   SpO2 98%   BMI 35.91 kg/m  Wt Readings from Last 3 Encounters:  01/07/23 243 lb 3.2 oz (110.3 kg)  12/04/22 236 lb (107 kg)  10/08/22 240 lb (108.9 kg)    Physical Exam Vitals reviewed.  Constitutional:      General: She is not in acute distress.    Appearance: Normal appearance.  HENT:     Head: Normocephalic and atraumatic.     Right Ear: Tympanic membrane, ear canal and external ear normal.     Left Ear: Tympanic membrane, ear canal and external ear normal.     Ears:     Comments: Increased pain -  palpation ear.     Mouth/Throat:     Pharynx: No oropharyngeal exudate or posterior oropharyngeal erythema.  Eyes:     General: No scleral icterus.       Right eye: No discharge.        Left eye: No discharge.     Conjunctiva/sclera: Conjunctivae normal.  Neck:     Thyroid: No thyromegaly.  Cardiovascular:     Rate and Rhythm: Normal rate and regular rhythm.  Pulmonary:     Effort: No respiratory distress.     Breath sounds: Normal breath sounds. No wheezing.  Abdominal:     General: Bowel sounds are normal.     Palpations: Abdomen is soft.     Tenderness: There is no abdominal tenderness.  Musculoskeletal:        General: No swelling or tenderness.     Cervical back: Neck supple. No tenderness.  Lymphadenopathy:     Cervical: No cervical adenopathy.  Skin:    Comments: Erythematous based rash - right side neck - small vesicles.    Neurological:     Mental Status: She is alert.  Psychiatric:        Mood and Affect: Mood normal.        Behavior: Behavior normal.      Outpatient Encounter Medications as of 01/07/2023  Medication Sig   aspirin 81 MG EC tablet Take 1 tablet (81 mg total) by mouth daily. Swallow whole.   Magnesium 250 MG TABS Take 250 mg by mouth daily.   sertraline (ZOLOFT) 100 MG tablet Take 1 tablet (100 mg total) by mouth daily.   valACYclovir (VALTREX) 1000 MG tablet Take 1 tablet (1,000 mg total) by mouth 3 (three) times daily for 10 days.   valsartan-hydrochlorothiazide (DIOVAN-HCT) 160-12.5 MG tablet Take 1 tablet by mouth daily.   No facility-administered encounter medications on file as of 01/07/2023.     Lab Results  Component Value Date   WBC 4.6 12/04/2022   HGB 15.2 (H) 12/04/2022   HCT 44.9 12/04/2022   PLT 252.0 12/04/2022   GLUCOSE 97 12/04/2022   CHOL 219 (H) 12/04/2022   TRIG 154.0 (H) 12/04/2022   HDL 44.80 12/04/2022   LDLCALC 144 (H) 12/04/2022   ALT 30 12/21/2022   AST 23 12/21/2022   NA 139 12/04/2022   K 3.6 12/04/2022    CL 105 12/04/2022   CREATININE 0.93 12/04/2022   BUN 19 12/04/2022   CO2 27 12/04/2022   TSH 0.88 09/04/2022   HGBA1C 5.6 12/12/2019    MM 3D SCREEN BREAST BILATERAL  Result Date: 02/11/2022 CLINICAL DATA:  Screening. EXAM:  DIGITAL SCREENING BILATERAL MAMMOGRAM WITH TOMOSYNTHESIS AND CAD TECHNIQUE: Bilateral screening digital craniocaudal and mediolateral oblique mammograms were obtained. Bilateral screening digital breast tomosynthesis was performed. The images were evaluated with computer-aided detection. COMPARISON:  Previous exam(s). ACR Breast Density Category c: The breast tissue is heterogeneously dense, which may obscure small masses. FINDINGS: There are no findings suspicious for malignancy. IMPRESSION: No mammographic evidence of malignancy. A result letter of this screening mammogram will be mailed directly to the patient. RECOMMENDATION: Screening mammogram in one year. (Code:SM-B-01Y) BI-RADS CATEGORY  1: Negative. Electronically Signed   By: Beckie Salts M.D.   On: 02/11/2022 17:02       Assessment & Plan:  Hypertension, essential Assessment & Plan: Blood pressure as outlined. Continue valsartan/hctz 160/12.5.   Follow pressures.  Follow metabolic panel.    Rash Assessment & Plan: Rash and pain - concern regarding shingles.  Rash - right side neck. Pain extending up into the ear and scalp.  Concern regarding shingles.  Treat with valtrex as directed.  Tylenol as directed.  Follow.  Refer to ENT - given concern shingles - right side neck/ear.    Orders: -     Ambulatory referral to ENT  Stress Assessment & Plan: Increased stress as outlined.  Increased anxiety.  Continue zoloft.  Start buspar as directed.  Follow.  Call with update.    Other orders -     valACYclovir HCl; Take 1 tablet (1,000 mg total) by mouth 3 (three) times daily for 10 days.  Dispense: 21 tablet; Refill: 0     Dale [place], MD

## 2023-01-09 ENCOUNTER — Encounter: Payer: Self-pay | Admitting: Internal Medicine

## 2023-01-09 ENCOUNTER — Telehealth: Payer: Self-pay | Admitting: Internal Medicine

## 2023-01-09 NOTE — Assessment & Plan Note (Signed)
Rash and pain - concern regarding shingles.  Rash - right side neck. Pain extending up into the ear and scalp.  Concern regarding shingles.  Treat with valtrex as directed.  Tylenol as directed.  Follow.  Refer to ENT - given concern shingles - right side neck/ear.

## 2023-01-09 NOTE — Assessment & Plan Note (Signed)
Blood pressure as outlined. Continue valsartan/hctz 160/12.5.   Follow pressures.  Follow metabolic panel.  

## 2023-01-09 NOTE — Telephone Encounter (Signed)
She was evaluated 01/07/23.  Concern regarding shingles.  Please confirm doing ok.  Also, let her know that I have placed order for referral to ENT - for shingles/ear - evaluation.  Also, she was having issues with increased stress.  I would like to start her on buspar.  If agreeable, I would like to start buspar 5mg  bid.

## 2023-01-09 NOTE — Assessment & Plan Note (Signed)
Increased stress as outlined.  Increased anxiety.  Continue zoloft.  Start buspar as directed.  Follow.  Call with update.

## 2023-01-11 MED ORDER — BUSPIRONE HCL 5 MG PO TABS: 5.0000 mg | ORAL_TABLET | Freq: Two times a day (BID) | ORAL | 2 refills | Status: DC

## 2023-01-11 NOTE — Telephone Encounter (Signed)
Patient states she is returning our call.  I read Dr. Westley Hummer Scott's message to patient.  Patient states she is doing ok.  Patient states she does have blisters forming now and a new patch developed on her chest area.  Patient states she is willing to take the buspar 5mg  bid.    Patient states her preferred pharmacy is [facility] in [place].

## 2023-01-11 NOTE — Addendum Note (Signed)
Addended by: [place], Shedrick Sarli on: 01/11/2023 10:40 AM   Modules accepted: Orders

## 2023-01-11 NOTE — Telephone Encounter (Signed)
LVM to call back to office  

## 2023-01-13 ENCOUNTER — Encounter: Payer: Self-pay | Admitting: Internal Medicine

## 2023-01-13 NOTE — Telephone Encounter (Signed)
Please call her and let her know that I would like for the lesions to be completely crusted.  Have her remain out this week and return - [place] if crusted over.   Let me know if any problems or questions.

## 2023-01-14 NOTE — Telephone Encounter (Signed)
LMTCB

## 2023-01-18 ENCOUNTER — Other Ambulatory Visit: Payer: Self-pay | Admitting: Internal Medicine

## 2023-01-20 NOTE — Telephone Encounter (Signed)
Rx sent in for buspar #60 with 2 refills.  Since just started medication, will continue 30 days supply.

## 2023-01-29 ENCOUNTER — Other Ambulatory Visit: Payer: Self-pay | Admitting: Internal Medicine

## 2023-02-16 ENCOUNTER — Other Ambulatory Visit: Payer: Self-pay | Admitting: Internal Medicine

## 2023-02-16 NOTE — Telephone Encounter (Signed)
Medication was discontinued on 12/04/2022. Is it okay to refuse?

## 2023-02-17 ENCOUNTER — Encounter (INDEPENDENT_AMBULATORY_CARE_PROVIDER_SITE_OTHER): Payer: Self-pay

## 2023-03-16 ENCOUNTER — Encounter: Payer: Self-pay | Admitting: Internal Medicine

## 2023-03-16 ENCOUNTER — Ambulatory Visit: Payer: No Typology Code available for payment source | Admitting: Internal Medicine

## 2023-03-16 VITALS — BP 138/82 | HR 86 | Temp 98.7°F | Ht 69.0 in | Wt 242.0 lb

## 2023-03-16 DIAGNOSIS — E041 Nontoxic single thyroid nodule: Secondary | ICD-10-CM

## 2023-03-16 DIAGNOSIS — E7800 Pure hypercholesterolemia, unspecified: Secondary | ICD-10-CM | POA: Diagnosis not present

## 2023-03-16 DIAGNOSIS — Z1231 Encounter for screening mammogram for malignant neoplasm of breast: Secondary | ICD-10-CM | POA: Diagnosis not present

## 2023-03-16 DIAGNOSIS — I10 Essential (primary) hypertension: Secondary | ICD-10-CM

## 2023-03-16 DIAGNOSIS — F439 Reaction to severe stress, unspecified: Secondary | ICD-10-CM

## 2023-03-16 DIAGNOSIS — Z1211 Encounter for screening for malignant neoplasm of colon: Secondary | ICD-10-CM

## 2023-03-16 DIAGNOSIS — Z0000 Encounter for general adult medical examination without abnormal findings: Secondary | ICD-10-CM | POA: Diagnosis not present

## 2023-03-16 LAB — LIPID PANEL
Cholesterol: 243 mg/dL — ABNORMAL HIGH (ref 0–200)
HDL: 62 mg/dL (ref >39.00)
LDL Cholesterol: 157 mg/dL — ABNORMAL HIGH (ref 0–99)
NonHDL: 180.87
Total CHOL/HDL Ratio: 4
Triglycerides: 119 mg/dL (ref 0.0–149.0)
VLDL: 23.8 mg/dL (ref 0.0–40.0)

## 2023-03-16 LAB — BASIC METABOLIC PANEL
BUN: 29 mg/dL — ABNORMAL HIGH (ref 6–23)
CO2: 28 mEq/L (ref 19–32)
Calcium: 9.5 mg/dL (ref 8.4–10.5)
Chloride: 103 mEq/L (ref 96–112)
Creatinine, Ser: 0.98 mg/dL (ref 0.40–1.20)
GFR: 67.63 mL/min (ref >60.00)
Glucose, Bld: 105 mg/dL — ABNORMAL HIGH (ref 70–99)
Potassium: 4.3 mEq/L (ref 3.5–5.1)
Sodium: 139 mEq/L (ref 135–145)

## 2023-03-16 LAB — CBC WITH DIFFERENTIAL/PLATELET
Basophils Absolute: 0 10*3/uL (ref 0.0–0.1)
Basophils Relative: 0.4 % (ref 0.0–3.0)
Eosinophils Absolute: 0.3 10*3/uL (ref 0.0–0.7)
Eosinophils Relative: 2.5 % (ref 0.0–5.0)
HCT: 48.2 % — ABNORMAL HIGH (ref 36.0–46.0)
Hemoglobin: 15.5 g/dL — ABNORMAL HIGH (ref 12.0–15.0)
Lymphocytes Relative: 6.2 % — ABNORMAL LOW (ref 12.0–46.0)
Lymphs Abs: 0.7 10*3/uL (ref 0.7–4.0)
MCHC: 32.3 g/dL (ref 30.0–36.0)
MCV: 95.3 fl (ref 78.0–100.0)
Monocytes Absolute: 0.9 10*3/uL (ref 0.1–1.0)
Monocytes Relative: 7.5 % (ref 3.0–12.0)
Neutro Abs: 9.6 10*3/uL — ABNORMAL HIGH (ref 1.4–7.7)
Neutrophils Relative %: 83.4 % — ABNORMAL HIGH (ref 43.0–77.0)
Platelets: 271 10*3/uL (ref 150.0–400.0)
RBC: 5.06 Mil/uL (ref 3.87–5.11)
RDW: 14.4 % (ref 11.5–15.5)
WBC: 11.5 10*3/uL — ABNORMAL HIGH (ref 4.0–10.5)

## 2023-03-16 LAB — HEPATIC FUNCTION PANEL
ALT: 61 U/L — ABNORMAL HIGH (ref 0–35)
AST: 66 U/L — ABNORMAL HIGH (ref 0–37)
Albumin: 4.2 g/dL (ref 3.5–5.2)
Alkaline Phosphatase: 92 U/L (ref 39–117)
Bilirubin, Direct: 0.1 mg/dL (ref 0.0–0.3)
Total Bilirubin: 0.5 mg/dL (ref 0.2–1.2)
Total Protein: 7 g/dL (ref 6.0–8.3)

## 2023-03-16 MED ORDER — AMLODIPINE BESYLATE 2.5 MG PO TABS
2.5000 mg | ORAL_TABLET | Freq: Every day | ORAL | 2 refills | Status: AC
Start: 2023-03-16 — End: ?

## 2023-03-16 NOTE — Assessment & Plan Note (Addendum)
Physical today (03/16/23).  Has been getting pap smears through gyn.  Schedule mammogram.  Last 02/10/22 - Birads I. Colonoscopy 10/2021 - recommended f/u in 3 years.

## 2023-03-16 NOTE — Progress Notes (Signed)
Subjective:    Patient ID: Cindy Martin, female    DOB: [birth year], 52 y.o.   MRN: [id number]  Patient here for  Chief Complaint  Patient presents with   Annual Exam    HPI Here for a physical exam. Increased stress.  Has been on zoloft.  Prescribed buspar 01/07/23. On valsartan/hydrochlorothiazide for her blood pressure.  Blood pressure has been varying.  Mostly elevated.  She has on a few occasions - lower blood pressure.  Will get ear pain - when blood pressure is up or down.  No persistent headache.  No dizziness or light headedness.  No chest pain or sob.  No abdominal pain or bowel change reported.  Handling stress.  Taking zoloft and buspar.  This regimen is working well for her.     Past Medical History:  Diagnosis Date   Chicken pox    Hypertension    Menorrhagia    Past Surgical History:  Procedure Laterality Date   ABDOMINAL HYSTERECTOMY     COLONOSCOPY WITH PROPOFOL N/A 10/24/2021   Procedure: COLONOSCOPY WITH PROPOFOL;  Surgeon: Wyline Mood, MD;  Location: [facility] ENDOSCOPY;  Service: Gastroenterology;  Laterality: N/A;   CYSTOSCOPY N/A 01/30/2020   Procedure: CYSTOSCOPY;  Surgeon: Nadara Mustard, MD;  Location: [facility] ORS;  Service: Gynecology;  Laterality: N/A;   TOTAL LAPAROSCOPIC HYSTERECTOMY WITH SALPINGECTOMY Bilateral 01/30/2020   Procedure: TOTAL LAPAROSCOPIC HYSTERECTOMY WITH RIGHT SALPINGECTOMY;  Surgeon: Nadara Mustard, MD;  Location: [facility] ORS;  Service: Gynecology;  Laterality: Bilateral;   TUBAL LIGATION     Tube reversal     Family History  Problem Relation Age of Onset   Stroke Mother    Hypertension Mother    Arthritis Mother    Hypertension Sister    Asthma Daughter    Breast cancer Paternal Grandmother    Ovarian cancer Neg Hx    Social History   Socioeconomic History   Marital status: Married    Spouse name: Not on file   Number of children: Not on file   Years of education: Not on file   Highest education level: Not on file   Occupational History   Occupation: Preschool Teacher  Tobacco Use   Smoking status: Never   Smokeless tobacco: Never  Vaping Use   Vaping status: Never Used  Substance and Sexual Activity   Alcohol use: No   Drug use: No   Sexual activity: Yes    Birth control/protection: Surgical    Comment: Tubal ligation/reversed  Other Topics Concern   Not on file  Social History Narrative   Not on file   Social Determinants of Health   Financial Resource Strain: Not on file  Food Insecurity: Not on file  Transportation Needs: Not on file  Physical Activity: Not on file  Stress: Not on file  Social Connections: Not on file     Review of Systems  Constitutional:  Negative for appetite change and unexpected weight change.  HENT:  Negative for congestion, sinus pressure and sore throat.   Eyes:  Negative for pain and visual disturbance.  Respiratory:  Negative for cough, chest tightness and shortness of breath.   Cardiovascular:  Negative for chest pain, palpitations and leg swelling.  Gastrointestinal:  Negative for abdominal pain, diarrhea, nausea and vomiting.  Genitourinary:  Negative for difficulty urinating and dysuria.  Musculoskeletal:  Negative for joint swelling and myalgias.  Skin:  Negative for color change and rash.  Neurological:  Negative for dizziness.  No increased headache.   Hematological:  Negative for adenopathy. Does not bruise/bleed easily.  Psychiatric/Behavioral:  Negative for agitation and dysphoric mood.        Objective:     BP 138/82   Pulse 86   Temp 98.7 F (37.1 C)   Ht 5\' 9"  (1.753 m)   Wt 242 lb (109.8 kg)   LMP 01/02/2020 (Exact Date)   SpO2 99%   BMI 35.74 kg/m  Wt Readings from Last 3 Encounters:  03/16/23 242 lb (109.8 kg)  01/07/23 243 lb 3.2 oz (110.3 kg)  12/04/22 236 lb (107 kg)    Physical Exam Vitals reviewed.  Constitutional:      General: She is not in acute distress.    Appearance: Normal appearance. She is  well-developed.  HENT:     Head: Normocephalic and atraumatic.     Right Ear: External ear normal.     Left Ear: External ear normal.  Eyes:     General: No scleral icterus.       Right eye: No discharge.        Left eye: No discharge.     Conjunctiva/sclera: Conjunctivae normal.  Neck:     Thyroid: No thyromegaly.  Cardiovascular:     Rate and Rhythm: Normal rate and regular rhythm.  Pulmonary:     Effort: No tachypnea, accessory muscle usage or respiratory distress.     Breath sounds: Normal breath sounds. No decreased breath sounds or wheezing.  Chest:  Breasts:    Right: No inverted nipple, mass, nipple discharge or tenderness (no axillary adenopathy).     Left: No inverted nipple, mass, nipple discharge or tenderness (no axilarry adenopathy).  Abdominal:     General: Bowel sounds are normal.     Palpations: Abdomen is soft.     Tenderness: There is no abdominal tenderness.  Musculoskeletal:        General: No swelling or tenderness.     Cervical back: Neck supple.  Lymphadenopathy:     Cervical: No cervical adenopathy.  Skin:    Findings: No erythema or rash.  Neurological:     Mental Status: She is alert and oriented to person, place, and time.  Psychiatric:        Mood and Affect: Mood normal.        Behavior: Behavior normal.      Outpatient Encounter Medications as of 03/16/2023  Medication Sig   amLODipine (NORVASC) 2.5 MG tablet Take 1 tablet (2.5 mg total) by mouth daily.   aspirin 81 MG EC tablet Take 1 tablet (81 mg total) by mouth daily. Swallow whole.   busPIRone (BUSPAR) 5 MG tablet TAKE 1 TABLET BY MOUTH TWICE A DAY   Magnesium 250 MG TABS Take 250 mg by mouth daily.   sertraline (ZOLOFT) 100 MG tablet Take 1 tablet (100 mg total) by mouth daily.   valsartan-hydrochlorothiazide (DIOVAN-HCT) 160-12.5 MG tablet TAKE 1 TABLET BY MOUTH EVERY DAY   No facility-administered encounter medications on file as of 03/16/2023.     Lab Results  Component Value  Date   WBC 11.5 (H) 03/16/2023   HGB 15.5 (H) 03/16/2023   HCT 48.2 (H) 03/16/2023   PLT 271.0 03/16/2023   GLUCOSE 105 (H) 03/16/2023   CHOL 243 (H) 03/16/2023   TRIG 119.0 03/16/2023   HDL 62.00 03/16/2023   LDLCALC 157 (H) 03/16/2023   ALT 61 (H) 03/16/2023   AST 66 (H) 03/16/2023   NA 139 03/16/2023   K  4.3 03/16/2023   CL 103 03/16/2023   CREATININE 0.98 03/16/2023   BUN 29 (H) 03/16/2023   CO2 28 03/16/2023   TSH 0.88 09/04/2022   HGBA1C 5.6 12/12/2019    MM 3D SCREEN BREAST BILATERAL  Result Date: 02/11/2022 CLINICAL DATA:  Screening. EXAM: DIGITAL SCREENING BILATERAL MAMMOGRAM WITH TOMOSYNTHESIS AND CAD TECHNIQUE: Bilateral screening digital craniocaudal and mediolateral oblique mammograms were obtained. Bilateral screening digital breast tomosynthesis was performed. The images were evaluated with computer-aided detection. COMPARISON:  Previous exam(s). ACR Breast Density Category c: The breast tissue is heterogeneously dense, which may obscure small masses. FINDINGS: There are no findings suspicious for malignancy. IMPRESSION: No mammographic evidence of malignancy. A result letter of this screening mammogram will be mailed directly to the patient. RECOMMENDATION: Screening mammogram in one year. (Code:SM-B-01Y) BI-RADS CATEGORY  1: Negative. Electronically Signed   By: Beckie Salts M.D.   On: 02/11/2022 17:02       Assessment & Plan:  Routine general medical examination at a health care facility  Healthcare maintenance Assessment & Plan: Physical today (03/16/23).  Has been getting pap smears through gyn.  Schedule mammogram.  Last 02/10/22 - Birads I. Colonoscopy 10/2021 - recommended f/u in 3 years.     Hypercholesteremia Assessment & Plan: Low cholesterol diet and exercise.  She has adjusted her diet. Follow lipid panel.   Orders: -     CBC with Differential/Platelet -     Hepatic function panel -     Lipid panel  Hypertension, essential Assessment &  Plan: Blood pressure as outlined. Continue valsartan/hctz 160/12.5.   given most readings elevated (occasional lower pressure), will add low dose amlodipine 2.5mg  q day to her medication regimen. Follow pressures.  Follow metabolic panel.   Orders: -     Basic metabolic panel  Visit for screening mammogram -     3D Screening Mammogram, Left and Right; Future  Colon cancer screening Assessment & Plan: Colonoscopy 10/2021 - recommended f/u in 3 years.    Stress Assessment & Plan: Increased stress as outlined.  Increased anxiety.  Continue zoloft and buspar.  Follow.     Thyroid nodule Assessment & Plan:  S/p thyroid biopsy - benign (evaluated 01/2022).  Recommended f/u thyroid ultrasound and appt in one year. Needs to reschedule appt.    Other orders -     amLODIPine Besylate; Take 1 tablet (2.5 mg total) by mouth daily.  Dispense: 30 tablet; Refill: 2     Dale [place], MD

## 2023-03-18 ENCOUNTER — Other Ambulatory Visit: Payer: Self-pay | Admitting: *Deleted

## 2023-03-18 DIAGNOSIS — D72829 Elevated white blood cell count, unspecified: Secondary | ICD-10-CM

## 2023-03-18 DIAGNOSIS — R7989 Other specified abnormal findings of blood chemistry: Secondary | ICD-10-CM

## 2023-03-21 ENCOUNTER — Encounter: Payer: Self-pay | Admitting: Internal Medicine

## 2023-03-21 NOTE — Assessment & Plan Note (Addendum)
S/p thyroid biopsy - benign (evaluated 01/2022).  Recommended f/u thyroid ultrasound and appt in one year. Needs to reschedule appt.

## 2023-03-21 NOTE — Assessment & Plan Note (Signed)
Increased stress as outlined.  Increased anxiety.  Continue zoloft and buspar.  Follow.

## 2023-03-21 NOTE — Assessment & Plan Note (Signed)
Colonoscopy 10/2021 - recommended f/u in 3 years.  

## 2023-03-21 NOTE — Assessment & Plan Note (Signed)
Low cholesterol diet and exercise.  She has adjusted her diet.  Follow lipid panel.

## 2023-03-21 NOTE — Assessment & Plan Note (Signed)
Blood pressure as outlined. Continue valsartan/hctz 160/12.5.   given most readings elevated (occasional lower pressure), will add low dose amlodipine 2.5mg  q day to her medication regimen. Follow pressures.  Follow metabolic panel.

## 2023-03-24 ENCOUNTER — Other Ambulatory Visit (INDEPENDENT_AMBULATORY_CARE_PROVIDER_SITE_OTHER): Payer: No Typology Code available for payment source

## 2023-03-24 DIAGNOSIS — D72829 Elevated white blood cell count, unspecified: Secondary | ICD-10-CM | POA: Diagnosis not present

## 2023-03-24 DIAGNOSIS — R7989 Other specified abnormal findings of blood chemistry: Secondary | ICD-10-CM

## 2023-03-24 LAB — CBC WITH DIFFERENTIAL/PLATELET
Basophils Absolute: 0.1 10*3/uL (ref 0.0–0.1)
Basophils Relative: 1 % (ref 0.0–3.0)
Eosinophils Absolute: 0.4 10*3/uL (ref 0.0–0.7)
Eosinophils Relative: 6 % — ABNORMAL HIGH (ref 0.0–5.0)
HCT: 46.6 % — ABNORMAL HIGH (ref 36.0–46.0)
Hemoglobin: 15.2 g/dL — ABNORMAL HIGH (ref 12.0–15.0)
Lymphocytes Relative: 26 % (ref 12.0–46.0)
Lymphs Abs: 1.7 10*3/uL (ref 0.7–4.0)
MCHC: 32.7 g/dL (ref 30.0–36.0)
MCV: 95.1 fl (ref 78.0–100.0)
Monocytes Absolute: 0.6 10*3/uL (ref 0.1–1.0)
Monocytes Relative: 9.2 % (ref 3.0–12.0)
Neutro Abs: 3.8 10*3/uL (ref 1.4–7.7)
Neutrophils Relative %: 57.8 % (ref 43.0–77.0)
Platelets: 275 10*3/uL (ref 150.0–400.0)
RBC: 4.9 Mil/uL (ref 3.87–5.11)
RDW: 13.9 % (ref 11.5–15.5)
WBC: 6.5 10*3/uL (ref 4.0–10.5)

## 2023-03-24 LAB — HEPATIC FUNCTION PANEL
ALT: 30 U/L (ref 0–35)
AST: 21 U/L (ref 0–37)
Albumin: 4.1 g/dL (ref 3.5–5.2)
Alkaline Phosphatase: 83 U/L (ref 39–117)
Bilirubin, Direct: 0.1 mg/dL (ref 0.0–0.3)
Total Bilirubin: 0.6 mg/dL (ref 0.2–1.2)
Total Protein: 6.5 g/dL (ref 6.0–8.3)

## 2023-03-25 ENCOUNTER — Ambulatory Visit
Admission: RE | Admit: 2023-03-25 | Discharge: 2023-03-25 | Disposition: A | Discharge status: Home / Self Care | Payer: No Typology Code available for payment source | Source: Ambulatory Visit | Attending: Internal Medicine | Admitting: Internal Medicine

## 2023-03-25 DIAGNOSIS — Z1231 Encounter for screening mammogram for malignant neoplasm of breast: Secondary | ICD-10-CM | POA: Diagnosis present

## 2023-03-29 ENCOUNTER — Other Ambulatory Visit: Payer: Self-pay | Admitting: Internal Medicine

## 2023-03-29 ENCOUNTER — Telehealth: Payer: No Typology Code available for payment source | Admitting: Internal Medicine

## 2023-03-29 ENCOUNTER — Encounter: Payer: Self-pay | Admitting: Internal Medicine

## 2023-03-29 ENCOUNTER — Telehealth: Payer: Self-pay | Admitting: Internal Medicine

## 2023-03-29 DIAGNOSIS — R051 Acute cough: Secondary | ICD-10-CM

## 2023-03-29 DIAGNOSIS — I10 Essential (primary) hypertension: Secondary | ICD-10-CM

## 2023-03-29 MED ORDER — PREDNISONE 10 MG PO TABS: ORAL_TABLET | ORAL | 0 refills | Status: DC

## 2023-03-29 MED ORDER — CEFDINIR 300 MG PO CAPS: 300.0000 mg | ORAL_CAPSULE | Freq: Two times a day (BID) | ORAL | 0 refills | Status: DC

## 2023-03-29 MED ORDER — ALBUTEROL SULFATE HFA 108 (90 BASE) MCG/ACT IN AERS: 2.0000 | INHALATION_SPRAY | Freq: Four times a day (QID) | RESPIRATORY_TRACT | 0 refills | Status: DC | PRN

## 2023-03-29 NOTE — Progress Notes (Signed)
Patient ID: Cindy Martin, female   DOB: October 25, 1970, 52 y.o.   MRN: [id number]   Virtual Visit via video Note  I connected with Marcelyn Bruins by a video enabled telemedicine application and verified that I am speaking with the correct person using two identifiers. Location patient: home Location provider: work  Persons participating in the virtual visit: patient, provider  The limitations, risks, security and privacy concerns of performing an evaluation and management service by video and the availability of in person appointments have been discussed.  It has also been discussed with the patient that there may be a patient responsible charge related to this service. The patient expressed understanding and agreed to proceed.   Reason for visit: work in app  HPI: Work in with concerns regarding cough and congestion.  States symptoms started three days ago.  Increased nasal congestion and sinus pressure.  No fever.  Chest congestion.  Increased cough.  Hurts when takes a deep breath = right side back.  Yellow green mucus. Decrease taste.  No vomiting or diarrhea.  Has taken three covid test - all negative. Sore throat.     ROS: See pertinent positives and negatives per HPI.  Past Medical History:  Diagnosis Date   Chicken pox    Hypertension    Menorrhagia     Past Surgical History:  Procedure Laterality Date   ABDOMINAL HYSTERECTOMY     COLONOSCOPY WITH PROPOFOL N/A 10/24/2021   Procedure: COLONOSCOPY WITH PROPOFOL;  Surgeon: Wyline Mood, MD;  Location: [facility] ENDOSCOPY;  Service: Gastroenterology;  Laterality: N/A;   CYSTOSCOPY N/A 01/30/2020   Procedure: CYSTOSCOPY;  Surgeon: Nadara Mustard, MD;  Location: [facility] ORS;  Service: Gynecology;  Laterality: N/A;   TOTAL LAPAROSCOPIC HYSTERECTOMY WITH SALPINGECTOMY Bilateral 01/30/2020   Procedure: TOTAL LAPAROSCOPIC HYSTERECTOMY WITH RIGHT SALPINGECTOMY;  Surgeon: Nadara Mustard, MD;  Location: [facility] ORS;  Service: Gynecology;  Laterality:  Bilateral;   TUBAL LIGATION     Tube reversal      Family History  Problem Relation Age of Onset   Stroke Mother    Hypertension Mother    Arthritis Mother    Hypertension Sister    Asthma Daughter    Breast cancer Paternal Grandmother    Ovarian cancer Neg Hx     SOCIAL HX: reviewed.    Current Outpatient Medications:    cefdinir (OMNICEF) 300 MG capsule, Take 1 capsule (300 mg total) by mouth 2 (two) times daily., Disp: 20 capsule, Rfl: 0   predniSONE (DELTASONE) 10 MG tablet, Take 6 tablets x 1 day and then decrease by 1/2 tablet per day until down to zero mg., Disp: 39 tablet, Rfl: 0   amLODipine (NORVASC) 2.5 MG tablet, Take 1 tablet (2.5 mg total) by mouth daily., Disp: 30 tablet, Rfl: 2   aspirin 81 MG EC tablet, Take 1 tablet (81 mg total) by mouth daily. Swallow whole., Disp: 30 tablet, Rfl: 0   azithromycin (ZITHROMAX) 250 MG tablet, Take 2 tablets on day 1, then 1 tablet daily on days 2 through 5, Disp: 6 tablet, Rfl: 0   busPIRone (BUSPAR) 5 MG tablet, TAKE 1 TABLET BY MOUTH TWICE A DAY, Disp: 60 tablet, Rfl: 2   levalbuterol (XOPENEX HFA) 45 MCG/ACT inhaler, Inhale 2 puffs into the lungs every 6 (six) hours as needed for wheezing., Disp: 18 g, Rfl: 0   Magnesium 250 MG TABS, Take 250 mg by mouth daily., Disp: , Rfl:    sertraline (ZOLOFT) 100 MG tablet,  Take 1 tablet (100 mg total) by mouth daily., Disp: 30 tablet, Rfl: 3   valsartan-hydrochlorothiazide (DIOVAN-HCT) 160-12.5 MG tablet, TAKE 1 TABLET BY MOUTH EVERY DAY, Disp: 90 tablet, Rfl: 1  EXAM:  GENERAL: alert, oriented, appears well and in no acute distress  HEENT: atraumatic, conjunttiva clear, no obvious abnormalities on inspection of external nose and ears  NECK: normal movements of the head and neck  LUNGS: on inspection no signs of respiratory distress, breathing rate appears normal, no obvious gross SOB, gasping or wheezing. Increased cough with forced expiration.   CV: no obvious  cyanosis  PSYCH/NEURO: pleasant and cooperative, no obvious depression or anxiety, speech and thought processing grossly intact  ASSESSMENT AND PLAN:  Discussed the following assessment and plan:  Problem List Items Addressed This Visit     Hypertension, essential    Continue valsartan/hctz 160/12.5.   given most readings elevated (occasional lower pressure) added low dose amlodipine 2.5mg  q day to her medication regimen. Follow pressures.  Follow metabolic panel. May need to adjust medication further.  Treat above.       Cough - Primary    Increased cough and congestion as outlined.  Three covid test negative.  Symptoms appear to be c/w sinus/URI.  Treat with omnicef as directed.  Prednisone taper.  Xopenex prn.  Saline nasal spray and steroid nasal spray as directed.  Follow.  Call with update. If persistent pain and increased symptoms, will check cxr to confirm no pneumonia.         Return if symptoms worsen or fail to improve.   I discussed the assessment and treatment plan with the patient. The patient was provided an opportunity to ask questions and all were answered. The patient agreed with the plan and demonstrated an understanding of the instructions.   The patient was advised to call back or seek an in-person evaluation if the symptoms worsen or if the condition fails to improve as anticipated.    Dale [place], MD

## 2023-03-29 NOTE — Telephone Encounter (Signed)
Letter sent for pt to remain out of work.

## 2023-03-30 ENCOUNTER — Telehealth: Payer: Self-pay

## 2023-03-30 DIAGNOSIS — R051 Acute cough: Secondary | ICD-10-CM

## 2023-03-30 NOTE — Telephone Encounter (Signed)
I had a request from pharmacy to change inhaler to xopenex.  I had sent in albuterol yesterday.  I have sent in the rx for xopenex.  Please cancel albuterol inhaler.  Please notify pt and confirm she is doing ok.

## 2023-03-30 NOTE — Telephone Encounter (Signed)
See phone note

## 2023-03-30 NOTE — Telephone Encounter (Signed)
Patient called in to let us know that she is not feeling any worse but she does not feel any better. She started her prednisone this morning and is going to pick up the xopenex inhaler. She is still having the right sided pain that was discussed yesterday

## 2023-03-30 NOTE — Telephone Encounter (Signed)
Patient states Dr. Dale [place] asked her to call us and let her know how she is doing.  Patient states everything is still the same.

## 2023-03-30 NOTE — Telephone Encounter (Signed)
Continue treatment as we discussed.  See if agreeable for cxr - can do stat (at [facility] or here?). Confirm no increased sob.

## 2023-03-30 NOTE — Telephone Encounter (Signed)
No increased sob. Cxr ordered for [facility] STAT. Patient is going to go in the morning. Order placed.

## 2023-03-31 ENCOUNTER — Ambulatory Visit
Admission: RE | Admit: 2023-03-31 | Discharge: 2023-03-31 | Disposition: A | Discharge status: Home / Self Care | Payer: No Typology Code available for payment source | Attending: Internal Medicine | Admitting: Internal Medicine

## 2023-03-31 ENCOUNTER — Ambulatory Visit
Admission: RE | Admit: 2023-03-31 | Discharge: 2023-03-31 | Disposition: A | Discharge status: Home / Self Care | Payer: No Typology Code available for payment source | Source: Ambulatory Visit | Attending: Internal Medicine | Admitting: Internal Medicine

## 2023-03-31 DIAGNOSIS — R051 Acute cough: Secondary | ICD-10-CM | POA: Insufficient documentation

## 2023-04-01 ENCOUNTER — Other Ambulatory Visit: Payer: Self-pay | Admitting: *Deleted

## 2023-04-01 MED ORDER — AZITHROMYCIN 250 MG PO TABS: ORAL_TABLET | ORAL | 0 refills | Status: AC

## 2023-04-03 ENCOUNTER — Encounter: Payer: Self-pay | Admitting: Internal Medicine

## 2023-04-03 DIAGNOSIS — R059 Cough, unspecified: Secondary | ICD-10-CM | POA: Insufficient documentation

## 2023-04-03 NOTE — Assessment & Plan Note (Signed)
Increased cough and congestion as outlined.  Three covid test negative.  Symptoms appear to be c/w sinus/URI.  Treat with omnicef as directed.  Prednisone taper.  Xopenex prn.  Saline nasal spray and steroid nasal spray as directed.  Follow.  Call with update. If persistent pain and increased symptoms, will check cxr to confirm no pneumonia.

## 2023-04-03 NOTE — Assessment & Plan Note (Signed)
Continue valsartan/hctz 160/12.5.   given most readings elevated (occasional lower pressure) added low dose amlodipine 2.5mg  q day to her medication regimen. Follow pressures.  Follow metabolic panel. May need to adjust medication further.  Treat above.

## 2023-04-05 ENCOUNTER — Telehealth: Payer: Self-pay | Admitting: Internal Medicine

## 2023-04-05 NOTE — Telephone Encounter (Signed)
Reviewed.  Appears to be getting better. Please confirm.  If any worsening change in symptoms or problems, needs to be evaluated.

## 2023-04-05 NOTE — Telephone Encounter (Signed)
Pt concerned about the wheezing she is experiencing.  Had to stop xopenex  due to breaking out in a rash last week.  Schedule to see Arville Lime 04/06/23 for evaluation.

## 2023-04-05 NOTE — Telephone Encounter (Signed)
Patient had a vritua with Dr Lorin Picket last week. She still is wheezing in her chest. Wanted to know what she can do for this. She has finished her medication.

## 2023-04-06 ENCOUNTER — Ambulatory Visit: Payer: No Typology Code available for payment source | Admitting: Nurse Practitioner

## 2023-04-08 ENCOUNTER — Encounter: Payer: Self-pay | Admitting: Internal Medicine

## 2023-04-12 NOTE — Telephone Encounter (Signed)
Can increase amlodipine to 5mg  q day.  Make sure has f/u appt scheduled.

## 2023-04-20 ENCOUNTER — Other Ambulatory Visit: Payer: Self-pay

## 2023-04-20 MED ORDER — AMLODIPINE BESYLATE 5 MG PO TABS: 5.0000 mg | ORAL_TABLET | Freq: Every day | ORAL | 2 refills | Status: DC

## 2023-04-22 ENCOUNTER — Other Ambulatory Visit: Payer: Self-pay | Admitting: Internal Medicine

## 2023-04-24 ENCOUNTER — Other Ambulatory Visit: Payer: Self-pay | Admitting: Internal Medicine

## 2023-04-25 ENCOUNTER — Other Ambulatory Visit: Payer: Self-pay | Admitting: Internal Medicine

## 2023-05-04 ENCOUNTER — Ambulatory Visit: Payer: No Typology Code available for payment source | Admitting: Internal Medicine

## 2023-05-04 ENCOUNTER — Encounter: Payer: Self-pay | Admitting: Internal Medicine

## 2023-05-04 VITALS — BP 130/70 | HR 89 | Temp 98.0°F | Resp 16 | Ht 69.0 in | Wt 250.0 lb

## 2023-05-04 DIAGNOSIS — F439 Reaction to severe stress, unspecified: Secondary | ICD-10-CM

## 2023-05-04 DIAGNOSIS — I10 Essential (primary) hypertension: Secondary | ICD-10-CM | POA: Diagnosis not present

## 2023-05-04 DIAGNOSIS — E7800 Pure hypercholesterolemia, unspecified: Secondary | ICD-10-CM | POA: Diagnosis not present

## 2023-05-04 DIAGNOSIS — Z1211 Encounter for screening for malignant neoplasm of colon: Secondary | ICD-10-CM

## 2023-05-04 DIAGNOSIS — E041 Nontoxic single thyroid nodule: Secondary | ICD-10-CM

## 2023-05-04 NOTE — Assessment & Plan Note (Signed)
Colonoscopy 10/2021 - recommended f/u in 3 years.

## 2023-05-04 NOTE — Assessment & Plan Note (Signed)
S/p thyroid biopsy - benign (evaluated 01/2022).  Recommended f/u thyroid ultrasound and appt in one year. Needs to reschedule appt. Discussed.

## 2023-05-04 NOTE — Assessment & Plan Note (Signed)
Continues on zoloft and buspar.  Doing better. Follow.

## 2023-05-04 NOTE — Progress Notes (Signed)
Subjective:    Patient ID: Cindy Martin, female    DOB: 10-11-1970, 52 y.o.   MRN: [id number]  Patient here for  Chief Complaint  Patient presents with   Medical Management of Chronic Issues    HPI Here to follow up regarding her blood pressure and increased stress.  Currently on zoloft and buspar.  This regiment has seemed to work well for her. Handling stress.  Has adjusted her hours at work. Her blood pressure has been elevated.  Currently on valsartan/hydrochlorothiazide and amlodipine. Blood pressure now doing better.  States outside readings averaging <130/80.  No chest pain or sob.  No headache or head pain.  No ear pain.  No cough or congestion.  No abdominal pain or bowel change. Overdue thyroid f/u.  Discussed. Also discussed weight and diet and exercise. She has adjusted her diet and has started exercising.    Past Medical History:  Diagnosis Date   Chicken pox    Hypertension    Menorrhagia    Past Surgical History:  Procedure Laterality Date   ABDOMINAL HYSTERECTOMY     COLONOSCOPY WITH PROPOFOL N/A 10/24/2021   Procedure: COLONOSCOPY WITH PROPOFOL;  Surgeon: Wyline Mood, MD;  Location: [facility] ENDOSCOPY;  Service: Gastroenterology;  Laterality: N/A;   CYSTOSCOPY N/A 01/30/2020   Procedure: CYSTOSCOPY;  Surgeon: Nadara Mustard, MD;  Location: [facility] ORS;  Service: Gynecology;  Laterality: N/A;   TOTAL LAPAROSCOPIC HYSTERECTOMY WITH SALPINGECTOMY Bilateral 01/30/2020   Procedure: TOTAL LAPAROSCOPIC HYSTERECTOMY WITH RIGHT SALPINGECTOMY;  Surgeon: Nadara Mustard, MD;  Location: [facility] ORS;  Service: Gynecology;  Laterality: Bilateral;   TUBAL LIGATION     Tube reversal     Family History  Problem Relation Age of Onset   Stroke Mother    Hypertension Mother    Arthritis Mother    Hypertension Sister    Asthma Daughter    Breast cancer Paternal Grandmother    Ovarian cancer Neg Hx    Social History   Socioeconomic History   Marital status: Married    Spouse  name: Not on file   Number of children: Not on file   Years of education: Not on file   Highest education level: Not on file  Occupational History   Occupation: Preschool Teacher  Tobacco Use   Smoking status: Never   Smokeless tobacco: Never  Vaping Use   Vaping status: Never Used  Substance and Sexual Activity   Alcohol use: No   Drug use: No   Sexual activity: Yes    Birth control/protection: Surgical    Comment: Tubal ligation/reversed  Other Topics Concern   Not on file  Social History Narrative   Not on file   Social Determinants of Health   Financial Resource Strain: Not on file  Food Insecurity: Not on file  Transportation Needs: Not on file  Physical Activity: Not on file  Stress: Not on file  Social Connections: Not on file     Review of Systems  Constitutional:  Negative for appetite change and unexpected weight change.  HENT:  Negative for congestion and sinus pressure.   Respiratory:  Negative for cough, chest tightness and shortness of breath.   Cardiovascular:  Negative for chest pain and palpitations.  Gastrointestinal:  Negative for abdominal pain, diarrhea, nausea and vomiting.  Genitourinary:  Negative for difficulty urinating and dysuria.  Musculoskeletal:  Negative for joint swelling and myalgias.  Skin:  Negative for color change and rash.  Neurological:  Negative for dizziness,  light-headedness and headaches.  Psychiatric/Behavioral:  Negative for agitation and dysphoric mood.        Objective:     BP 130/70   Pulse 89   Temp 98 F (36.7 C)   Resp 16   Ht 5\' 9"  (1.753 m)   Wt 250 lb (113.4 kg)   LMP 01/02/2020 (Exact Date)   SpO2 98%   BMI 36.92 kg/m  Wt Readings from Last 3 Encounters:  05/04/23 250 lb (113.4 kg)  03/16/23 242 lb (109.8 kg)  01/07/23 243 lb 3.2 oz (110.3 kg)    Physical Exam Vitals reviewed.  Constitutional:      General: She is not in acute distress.    Appearance: Normal appearance.  HENT:     Head:  Normocephalic and atraumatic.     Right Ear: External ear normal.     Left Ear: External ear normal.  Eyes:     General: No scleral icterus.       Right eye: No discharge.        Left eye: No discharge.     Conjunctiva/sclera: Conjunctivae normal.  Neck:     Thyroid: No thyromegaly.  Cardiovascular:     Rate and Rhythm: Normal rate and regular rhythm.  Pulmonary:     Effort: No respiratory distress.     Breath sounds: Normal breath sounds. No wheezing.  Abdominal:     General: Bowel sounds are normal.     Palpations: Abdomen is soft.     Tenderness: There is no abdominal tenderness.  Musculoskeletal:        General: No swelling or tenderness.     Cervical back: Neck supple. No tenderness.  Lymphadenopathy:     Cervical: No cervical adenopathy.  Skin:    Findings: No erythema or rash.  Neurological:     Mental Status: She is alert.  Psychiatric:        Mood and Affect: Mood normal.        Behavior: Behavior normal.      Outpatient Encounter Medications as of 05/04/2023  Medication Sig   amLODipine (NORVASC) 5 MG tablet Take 1 tablet (5 mg total) by mouth daily.   aspirin 81 MG EC tablet Take 1 tablet (81 mg total) by mouth daily. Swallow whole.   busPIRone (BUSPAR) 5 MG tablet TAKE 1 TABLET BY MOUTH TWICE A DAY   Magnesium 250 MG TABS Take 250 mg by mouth daily.   sertraline (ZOLOFT) 100 MG tablet TAKE 1 TABLET BY MOUTH EVERY DAY   valsartan-hydrochlorothiazide (DIOVAN-HCT) 160-12.5 MG tablet TAKE 1 TABLET BY MOUTH EVERY DAY   [DISCONTINUED] cefdinir (OMNICEF) 300 MG capsule Take 1 capsule (300 mg total) by mouth 2 (two) times daily.   [DISCONTINUED] levalbuterol (XOPENEX HFA) 45 MCG/ACT inhaler Inhale 2 puffs into the lungs every 6 (six) hours as needed for wheezing.   [DISCONTINUED] predniSONE (DELTASONE) 10 MG tablet Take 6 tablets x 1 day and then decrease by 1/2 tablet per day until down to zero mg.   No facility-administered encounter medications on file as of  05/04/2023.     Lab Results  Component Value Date   WBC 6.5 03/24/2023   HGB 15.2 (H) 03/24/2023   HCT 46.6 (H) 03/24/2023   PLT 275.0 03/24/2023   GLUCOSE 105 (H) 03/16/2023   CHOL 243 (H) 03/16/2023   TRIG 119.0 03/16/2023   HDL 62.00 03/16/2023   LDLCALC 157 (H) 03/16/2023   ALT 30 03/24/2023   AST 21 03/24/2023  NA 139 03/16/2023   K 4.3 03/16/2023   CL 103 03/16/2023   CREATININE 0.98 03/16/2023   BUN 29 (H) 03/16/2023   CO2 28 03/16/2023   TSH 0.88 09/04/2022   HGBA1C 5.6 12/12/2019    DG Chest 2 View  Result Date: 03/31/2023 CLINICAL DATA:  Cough.  Right posterior back pain. EXAM: CHEST - 2 VIEW COMPARISON:  None Available. FINDINGS: Heart size and mediastinal contours are normal. There is no pleural fluid, interstitial edema or airspace disease. Mild chronic coarsened interstitial markings are noted bilaterally. Visualized osseous structures appear normal. IMPRESSION: 1. No acute findings. 2. Mild chronic coarsened interstitial markings. Electronically Signed   By: Signa Kell M.D.   On: 03/31/2023 09:07       Assessment & Plan:  Thyroid nodule Assessment & Plan:  S/p thyroid biopsy - benign (evaluated 01/2022).  Recommended f/u thyroid ultrasound and appt in one year. Needs to reschedule appt. Discussed.     Stress Assessment & Plan: Continues on zoloft and buspar.  Doing better. Follow.     Hypertension, essential Assessment & Plan: Continue valsartan/hctz 160/12.5 and amlodipine 5mg .  Blood pressure doing better.  Continue to spot check pressure.  Follow metabolic panel.    Hypercholesteremia Assessment & Plan: Low cholesterol diet and exercise.  She has adjusted her diet. Follow lipid panel.    Colon cancer screening Assessment & Plan: Colonoscopy 10/2021 - recommended f/u in 3 years.       Dale Darden, MD

## 2023-05-04 NOTE — Assessment & Plan Note (Signed)
Continue valsartan/hctz 160/12.5 and amlodipine 5mg .  Blood pressure doing better.  Continue to spot check pressure.  Follow metabolic panel.

## 2023-05-04 NOTE — Patient Instructions (Signed)
[facility] Endocrinology Dr Tedd Sias [phone]

## 2023-05-04 NOTE — Assessment & Plan Note (Signed)
Low cholesterol diet and exercise.  She has adjusted her diet. Follow lipid panel.

## 2023-06-10 ENCOUNTER — Other Ambulatory Visit: Payer: Self-pay | Admitting: Internal Medicine

## 2023-07-18 ENCOUNTER — Other Ambulatory Visit: Payer: Self-pay | Admitting: Internal Medicine

## 2023-07-22 ENCOUNTER — Other Ambulatory Visit: Payer: Self-pay | Admitting: Internal Medicine

## 2023-07-25 ENCOUNTER — Other Ambulatory Visit: Payer: Self-pay | Admitting: Internal Medicine

## 2023-07-27 ENCOUNTER — Telehealth: Payer: Self-pay | Admitting: Internal Medicine

## 2023-07-27 NOTE — Telephone Encounter (Signed)
 Patient need lab orders.

## 2023-07-28 ENCOUNTER — Other Ambulatory Visit: Payer: Self-pay | Admitting: *Deleted

## 2023-07-28 DIAGNOSIS — I10 Essential (primary) hypertension: Secondary | ICD-10-CM

## 2023-07-28 DIAGNOSIS — E7800 Pure hypercholesterolemia, unspecified: Secondary | ICD-10-CM

## 2023-07-28 DIAGNOSIS — D72829 Elevated white blood cell count, unspecified: Secondary | ICD-10-CM

## 2023-07-28 DIAGNOSIS — R7989 Other specified abnormal findings of blood chemistry: Secondary | ICD-10-CM

## 2023-07-28 NOTE — Telephone Encounter (Signed)
 Labs ordered.

## 2023-07-29 ENCOUNTER — Encounter: Payer: Self-pay | Admitting: Internal Medicine

## 2023-07-29 DIAGNOSIS — R232 Flushing: Secondary | ICD-10-CM

## 2023-07-29 DIAGNOSIS — M791 Myalgia, unspecified site: Secondary | ICD-10-CM

## 2023-07-30 NOTE — Telephone Encounter (Signed)
 I do not mind ordering labs, but just need to clarify her symptoms. I know she is describing the hot flashes, but need to clarify her joint aches? Muscles aches?, etc - to help with specific labs to order.  Also, right now she had a met c and liver ordered for future labs.  This needs to be a met b and liver.  I am not sure how to cancel the met c.  I can order the met b with the other labs, just not sure how to cancel the met c.

## 2023-08-02 NOTE — Telephone Encounter (Signed)
 Spoke with patient. She is having joint aches in her hip and knee area- bilateral. Night sweats and hot flashes. I cancelled the met c

## 2023-08-02 NOTE — Telephone Encounter (Signed)
 Labs are in

## 2023-08-02 NOTE — Telephone Encounter (Signed)
 Noted.

## 2023-08-02 NOTE — Telephone Encounter (Signed)
 LMTCB

## 2023-08-03 ENCOUNTER — Other Ambulatory Visit (INDEPENDENT_AMBULATORY_CARE_PROVIDER_SITE_OTHER): Payer: No Typology Code available for payment source

## 2023-08-03 DIAGNOSIS — R232 Flushing: Secondary | ICD-10-CM

## 2023-08-03 DIAGNOSIS — D72829 Elevated white blood cell count, unspecified: Secondary | ICD-10-CM | POA: Diagnosis not present

## 2023-08-03 DIAGNOSIS — E7800 Pure hypercholesterolemia, unspecified: Secondary | ICD-10-CM

## 2023-08-03 DIAGNOSIS — R7989 Other specified abnormal findings of blood chemistry: Secondary | ICD-10-CM | POA: Diagnosis not present

## 2023-08-03 DIAGNOSIS — M791 Myalgia, unspecified site: Secondary | ICD-10-CM

## 2023-08-03 LAB — SEDIMENTATION RATE: Sed Rate: 13 mm/h (ref 0–30)

## 2023-08-03 LAB — LIPID PANEL
Cholesterol: 180 mg/dL (ref 0–200)
HDL: 36.3 mg/dL — ABNORMAL LOW (ref >39.00)
LDL Cholesterol: 109 mg/dL — ABNORMAL HIGH (ref 0–99)
NonHDL: 144.17
Total CHOL/HDL Ratio: 5
Triglycerides: 176 mg/dL — ABNORMAL HIGH (ref 0.0–149.0)
VLDL: 35.2 mg/dL (ref 0.0–40.0)

## 2023-08-03 LAB — CBC WITH DIFFERENTIAL/PLATELET
Basophils Absolute: 0 10*3/uL (ref 0.0–0.1)
Basophils Relative: 0.7 % (ref 0.0–3.0)
Eosinophils Absolute: 0.3 10*3/uL (ref 0.0–0.7)
Eosinophils Relative: 5.5 % — ABNORMAL HIGH (ref 0.0–5.0)
HCT: 46.1 % — ABNORMAL HIGH (ref 36.0–46.0)
Hemoglobin: 15.4 g/dL — ABNORMAL HIGH (ref 12.0–15.0)
Lymphocytes Relative: 22.9 % (ref 12.0–46.0)
Lymphs Abs: 1.4 10*3/uL (ref 0.7–4.0)
MCHC: 33.5 g/dL (ref 30.0–36.0)
MCV: 93.5 fL (ref 78.0–100.0)
Monocytes Absolute: 0.6 10*3/uL (ref 0.1–1.0)
Monocytes Relative: 9.4 % (ref 3.0–12.0)
Neutro Abs: 3.6 10*3/uL (ref 1.4–7.7)
Neutrophils Relative %: 61.5 % (ref 43.0–77.0)
Platelets: 263 10*3/uL (ref 150.0–400.0)
RBC: 4.93 Mil/uL (ref 3.87–5.11)
RDW: 13.5 % (ref 11.5–15.5)
WBC: 5.9 10*3/uL (ref 4.0–10.5)

## 2023-08-03 LAB — BASIC METABOLIC PANEL
BUN: 27 mg/dL — ABNORMAL HIGH (ref 6–23)
CO2: 27 meq/L (ref 19–32)
Calcium: 9 mg/dL (ref 8.4–10.5)
Chloride: 106 meq/L (ref 96–112)
Creatinine, Ser: 0.89 mg/dL (ref 0.40–1.20)
GFR: 75.71 mL/min (ref >60.00)
Glucose, Bld: 101 mg/dL — ABNORMAL HIGH (ref 70–99)
Potassium: 3.7 meq/L (ref 3.5–5.1)
Sodium: 142 meq/L (ref 135–145)

## 2023-08-03 LAB — FOLLICLE STIMULATING HORMONE: FSH: 13.5 m[IU]/mL

## 2023-08-03 LAB — TSH: TSH: 0.73 u[IU]/mL (ref 0.35–5.50)

## 2023-08-03 LAB — CK: Total CK: 44 U/L (ref 7–177)

## 2023-08-03 LAB — HEPATIC FUNCTION PANEL
ALT: 33 U/L (ref 0–35)
AST: 24 U/L (ref 0–37)
Albumin: 4.3 g/dL (ref 3.5–5.2)
Alkaline Phosphatase: 84 U/L (ref 39–117)
Bilirubin, Direct: 0 mg/dL (ref 0.0–0.3)
Total Bilirubin: 0.3 mg/dL (ref 0.2–1.2)
Total Protein: 6.7 g/dL (ref 6.0–8.3)

## 2023-08-05 ENCOUNTER — Encounter: Payer: Self-pay | Admitting: Internal Medicine

## 2023-08-05 ENCOUNTER — Ambulatory Visit: Payer: No Typology Code available for payment source | Admitting: Internal Medicine

## 2023-08-05 VITALS — BP 128/78 | HR 89 | Temp 98.0°F | Resp 16 | Ht 69.0 in | Wt 255.0 lb

## 2023-08-05 DIAGNOSIS — E041 Nontoxic single thyroid nodule: Secondary | ICD-10-CM

## 2023-08-05 DIAGNOSIS — M2550 Pain in unspecified joint: Secondary | ICD-10-CM

## 2023-08-05 DIAGNOSIS — I10 Essential (primary) hypertension: Secondary | ICD-10-CM | POA: Diagnosis not present

## 2023-08-05 DIAGNOSIS — R232 Flushing: Secondary | ICD-10-CM

## 2023-08-05 DIAGNOSIS — E7800 Pure hypercholesterolemia, unspecified: Secondary | ICD-10-CM | POA: Diagnosis not present

## 2023-08-05 DIAGNOSIS — F439 Reaction to severe stress, unspecified: Secondary | ICD-10-CM

## 2023-08-05 MED ORDER — GABAPENTIN 100 MG PO CAPS: 100.0000 mg | ORAL_CAPSULE | Freq: Every day | ORAL | 2 refills | Status: DC

## 2023-08-05 MED ORDER — AMLODIPINE BESYLATE 5 MG PO TABS: 5.0000 mg | ORAL_TABLET | Freq: Every day | ORAL | 1 refills | Status: DC

## 2023-08-05 MED ORDER — BUSPIRONE HCL 5 MG PO TABS: 5.0000 mg | ORAL_TABLET | Freq: Two times a day (BID) | ORAL | 1 refills | Status: DC

## 2023-08-05 NOTE — Progress Notes (Signed)
Subjective:    Patient ID: Cindy Martin, female    DOB: 04-03-1971, 53 y.o.   MRN: [id number]  Patient here for  Chief Complaint  Patient presents with   Medical Management of Chronic Issues    HPI Here to follow up regarding her blood pressure and increased stress. Currently on zoloft and buspar. Feels she is handling things well. She has previously seen Dr Tedd Sias - f/u thyroid. Overdue f/u and ultrasound. Discussed. She will call to follow up. Breathing stable. Reports some ear fluttering. No pain. No change in hearing. No congestion. Does report some increased hot flashes. Some hip and knee pain/stiffness - bilateral. Worse in am. Some pain at night - radiates back/hip.  Wakes up from sleep on occasion. Discussed treatment options.    Past Medical History:  Diagnosis Date   Chicken pox    Hypertension    Menorrhagia    Past Surgical History:  Procedure Laterality Date   ABDOMINAL HYSTERECTOMY     COLONOSCOPY WITH PROPOFOL N/A 10/24/2021   Procedure: COLONOSCOPY WITH PROPOFOL;  Surgeon: Wyline Mood, MD;  Location: [facility] ENDOSCOPY;  Service: Gastroenterology;  Laterality: N/A;   CYSTOSCOPY N/A 01/30/2020   Procedure: CYSTOSCOPY;  Surgeon: Nadara Mustard, MD;  Location: [facility] ORS;  Service: Gynecology;  Laterality: N/A;   TOTAL LAPAROSCOPIC HYSTERECTOMY WITH SALPINGECTOMY Bilateral 01/30/2020   Procedure: TOTAL LAPAROSCOPIC HYSTERECTOMY WITH RIGHT SALPINGECTOMY;  Surgeon: Nadara Mustard, MD;  Location: [facility] ORS;  Service: Gynecology;  Laterality: Bilateral;   TUBAL LIGATION     Tube reversal     Family History  Problem Relation Age of Onset   Stroke Mother    Hypertension Mother    Arthritis Mother    Hypertension Sister    Asthma Daughter    Breast cancer Paternal Grandmother    Ovarian cancer Neg Hx    Social History   Socioeconomic History   Marital status: Married    Spouse name: Not on file   Number of children: Not on file   Years of education: Not on file    Highest education level: Not on file  Occupational History   Occupation: Preschool Teacher  Tobacco Use   Smoking status: Never   Smokeless tobacco: Never  Vaping Use   Vaping status: Never Used  Substance and Sexual Activity   Alcohol use: No   Drug use: No   Sexual activity: Yes    Birth control/protection: Surgical    Comment: Tubal ligation/reversed  Other Topics Concern   Not on file  Social History Narrative   Not on file   Social Drivers of Health   Financial Resource Strain: Not on file  Food Insecurity: Not on file  Transportation Needs: Not on file  Physical Activity: Not on file  Stress: Not on file  Social Connections: Not on file     Review of Systems  Constitutional:  Negative for appetite change and unexpected weight change.  HENT:  Negative for congestion, postnasal drip and sinus pressure.        Ear fluttering as outlined.   Respiratory:  Negative for cough, chest tightness and shortness of breath.   Cardiovascular:  Negative for chest pain, palpitations and leg swelling.  Gastrointestinal:  Negative for abdominal pain, diarrhea, nausea and vomiting.  Genitourinary:  Negative for difficulty urinating and dysuria.  Musculoskeletal:  Negative for myalgias.       Hip and knee pain as outlined.   Skin:  Negative for color change and rash.  Neurological:  Negative for dizziness and headaches.  Psychiatric/Behavioral:  Negative for agitation and dysphoric mood.        Objective:     BP 128/78   Pulse 89   Temp 98 F (36.7 C)   Resp 16   Ht 5\' 9"  (1.753 m)   Wt 255 lb (115.7 kg)   LMP 01/02/2020 (Exact Date)   SpO2 98%   BMI 37.66 kg/m  Wt Readings from Last 3 Encounters:  08/05/23 255 lb (115.7 kg)  05/04/23 250 lb (113.4 kg)  03/16/23 242 lb (109.8 kg)    Physical Exam Vitals reviewed.  Constitutional:      General: She is not in acute distress.    Appearance: Normal appearance.  HENT:     Head: Normocephalic and atraumatic.      Right Ear: Tympanic membrane, ear canal and external ear normal.     Left Ear: Tympanic membrane, ear canal and external ear normal.     Mouth/Throat:     Pharynx: No oropharyngeal exudate or posterior oropharyngeal erythema.  Eyes:     General: No scleral icterus.       Right eye: No discharge.        Left eye: No discharge.     Conjunctiva/sclera: Conjunctivae normal.  Neck:     Thyroid: No thyromegaly.  Cardiovascular:     Rate and Rhythm: Normal rate and regular rhythm.  Pulmonary:     Effort: No respiratory distress.     Breath sounds: Normal breath sounds. No wheezing.  Abdominal:     General: Bowel sounds are normal.     Palpations: Abdomen is soft.     Tenderness: There is no abdominal tenderness.  Musculoskeletal:        General: No swelling or tenderness.     Cervical back: Neck supple. No tenderness.  Lymphadenopathy:     Cervical: No cervical adenopathy.  Skin:    Findings: No erythema or rash.  Neurological:     Mental Status: She is alert.  Psychiatric:        Mood and Affect: Mood normal.        Behavior: Behavior normal.         Outpatient Encounter Medications as of 08/05/2023  Medication Sig   gabapentin (NEURONTIN) 100 MG capsule Take 1 capsule (100 mg total) by mouth at bedtime.   amLODipine (NORVASC) 5 MG tablet Take 1 tablet (5 mg total) by mouth daily.   aspirin 81 MG EC tablet Take 1 tablet (81 mg total) by mouth daily. Swallow whole.   busPIRone (BUSPAR) 5 MG tablet Take 1 tablet (5 mg total) by mouth 2 (two) times daily.   Magnesium 250 MG TABS Take 250 mg by mouth daily.   sertraline (ZOLOFT) 100 MG tablet TAKE 1 TABLET BY MOUTH EVERY DAY   valsartan-hydrochlorothiazide (DIOVAN-HCT) 160-12.5 MG tablet TAKE 1 TABLET BY MOUTH EVERY DAY   [DISCONTINUED] amLODipine (NORVASC) 5 MG tablet Take 1 tablet (5 mg total) by mouth daily.   [DISCONTINUED] busPIRone (BUSPAR) 5 MG tablet TAKE 1 TABLET BY MOUTH TWICE A DAY   No facility-administered  encounter medications on file as of 08/05/2023.     Lab Results  Component Value Date   WBC 5.9 08/03/2023   HGB 15.4 (H) 08/03/2023   HCT 46.1 (H) 08/03/2023   PLT 263.0 08/03/2023   GLUCOSE 101 (H) 08/03/2023   CHOL 180 08/03/2023   TRIG 176.0 (H) 08/03/2023   HDL 36.30 (L) 08/03/2023  LDLCALC 109 (H) 08/03/2023   ALT 33 08/03/2023   AST 24 08/03/2023   NA 142 08/03/2023   K 3.7 08/03/2023   CL 106 08/03/2023   CREATININE 0.89 08/03/2023   BUN 27 (H) 08/03/2023   CO2 27 08/03/2023   TSH 0.73 08/03/2023   HGBA1C 5.6 12/12/2019    DG Chest 2 View Result Date: 03/31/2023 CLINICAL DATA:  Cough.  Right posterior back pain. EXAM: CHEST - 2 VIEW COMPARISON:  None Available. FINDINGS: Heart size and mediastinal contours are normal. There is no pleural fluid, interstitial edema or airspace disease. Mild chronic coarsened interstitial markings are noted bilaterally. Visualized osseous structures appear normal. IMPRESSION: 1. No acute findings. 2. Mild chronic coarsened interstitial markings. Electronically Signed   By: Signa Kell M.D.   On: 03/31/2023 09:07       Assessment & Plan:  Hypercholesteremia Assessment & Plan: Low cholesterol diet and exercise.  Follow lipid panel.   Orders: -     Lipid panel; Future -     Hepatic function panel; Future  Hypertension, essential Assessment & Plan: Continue valsartan/hctz 160/12.5 and amlodipine 5mg .  Blood pressure doing better.  Continue to spot check pressure.  Follow metabolic panel.   Orders: -     Basic metabolic panel; Future -     CBC with Differential/Platelet; Future  Thyroid nodule Assessment & Plan:  S/p thyroid biopsy - benign (evaluated 01/2022).  Recommended f/u thyroid ultrasound and appt in one year. Needs to reschedule appt. Discussed.  She will call.    Stress Assessment & Plan: Continues on zoloft and buspar.  Doing better. Follow.     Hot flashes Assessment & Plan: Symptoms as outlined.  Concerned  regarding possible menopause.  Is status post hysterectomy.  FSH - wnl. Discussed. Trial of gabapentin.    Arthralgia, unspecified joint Assessment & Plan: Describes hip and knee pain as outlined.  Also reports the pain in her back/hip/leg occasionally at night. Would like to avoid continued antiinflammatory use given history of hypertension. Trial of gabapentin to see if helps with sleep, hot flashes and pain.  Follow.  Call with update.    Other orders -     amLODIPine Besylate; Take 1 tablet (5 mg total) by mouth daily.  Dispense: 90 tablet; Refill: 1 -     busPIRone HCl; Take 1 tablet (5 mg total) by mouth 2 (two) times daily.  Dispense: 180 tablet; Refill: 1 -     Gabapentin; Take 1 capsule (100 mg total) by mouth at bedtime.  Dispense: 30 capsule; Refill: 2   Ear fluttering - no abnormality noted on exam. Blood pressure doing well. No sinus congestion. No hearing change. Discussed ENT evaluation. Wants to monitor.   Dale [place], MD

## 2023-08-05 NOTE — Assessment & Plan Note (Signed)
Symptoms as outlined.  Concerned regarding possible menopause.  Is status post hysterectomy.  FSH - wnl. Discussed. Trial of gabapentin.

## 2023-08-05 NOTE — Assessment & Plan Note (Addendum)
Low cholesterol diet and exercise.  Follow lipid panel.   

## 2023-08-05 NOTE — Assessment & Plan Note (Signed)
S/p thyroid biopsy - benign (evaluated 01/2022).  Recommended f/u thyroid ultrasound and appt in one year. Needs to reschedule appt. Discussed.  She will call.

## 2023-08-05 NOTE — Assessment & Plan Note (Signed)
Continue valsartan/hctz 160/12.5 and amlodipine 5mg .  Blood pressure doing better.  Continue to spot check pressure.  Follow metabolic panel.

## 2023-08-05 NOTE — Assessment & Plan Note (Signed)
Continues on zoloft and buspar.  Doing better. Follow.

## 2023-08-05 NOTE — Assessment & Plan Note (Signed)
Describes hip and knee pain as outlined.  Also reports the pain in her back/hip/leg occasionally at night. Would like to avoid continued antiinflammatory use given history of hypertension. Trial of gabapentin to see if helps with sleep, hot flashes and pain.  Follow.  Call with update.

## 2023-08-15 ENCOUNTER — Encounter: Payer: Self-pay | Admitting: Internal Medicine

## 2023-08-16 NOTE — Telephone Encounter (Signed)
She is only taking 100mg  gabapentin. Can titrate dose if feel would help. Given increased pain, may need referral to rheumatology.

## 2023-08-17 ENCOUNTER — Telehealth: Payer: Self-pay | Admitting: Internal Medicine

## 2023-08-17 NOTE — Telephone Encounter (Signed)
See my chart message

## 2023-08-17 NOTE — Telephone Encounter (Signed)
She can try increasing to 100mg  - 2 q hs.  I would like to refer her to rheumatology given increased pain.  Also, is she taking tylenol arthritis or any other pain medication. Her previous inflammatory marker was negative.  If persistent increased pain and hard walking - also recommend [facility] (given change in symptoms, etc).

## 2023-08-17 NOTE — Telephone Encounter (Signed)
What dose of gabapentin would you like her to try?   I tried calling her and had to leave message.

## 2023-08-17 NOTE — Telephone Encounter (Signed)
Patient is returning a cALL FROM THE OFFICE THERE IS NO NOTES ON THE PATIENT CHART TO RELAY TO HER

## 2023-08-18 MED ORDER — GABAPENTIN 100 MG PO CAPS: ORAL_CAPSULE | ORAL | 2 refills | Status: DC

## 2023-08-18 NOTE — Telephone Encounter (Signed)
Please call her and let her know that I did send in the rx for gabapentin 100mg  - 2 q hs. Also, given persistent issues - I had stated In last message, refer to rheumatology or [facility]. Even though she is doing better, I would still like to refer her to ortho for further evaluation - to help determine etiology of pain.

## 2023-08-19 NOTE — Telephone Encounter (Signed)
[facility] is going to [facility] tomorrow or Saturday. Will let me know if she needs anything more.

## 2023-10-05 DIAGNOSIS — M549 Dorsalgia, unspecified: Secondary | ICD-10-CM

## 2023-10-05 HISTORY — DX: Dorsalgia, unspecified: M54.9

## 2023-11-08 ENCOUNTER — Other Ambulatory Visit: Payer: No Typology Code available for payment source

## 2023-11-08 ENCOUNTER — Telehealth: Payer: Self-pay

## 2023-11-08 DIAGNOSIS — U071 COVID-19: Secondary | ICD-10-CM

## 2023-11-08 HISTORY — DX: COVID-19: U07.1

## 2023-11-08 NOTE — Telephone Encounter (Signed)
 Copied from CRM [id number]. Topic: Clinical - Medical Advice >> Nov 08, 2023  8:54 AM [place] A wrote: Reason for CRM: Patient tested positive for Covid would like to know what the provider wants her to do regarding her appointment tomorrow 11/09/23

## 2023-11-08 NOTE — Telephone Encounter (Signed)
Appointment changed to a virtual.

## 2023-11-09 ENCOUNTER — Encounter: Payer: Self-pay | Admitting: Internal Medicine

## 2023-11-09 ENCOUNTER — Telehealth (INDEPENDENT_AMBULATORY_CARE_PROVIDER_SITE_OTHER): Payer: No Typology Code available for payment source | Admitting: Internal Medicine

## 2023-11-09 VITALS — BP 128/81 | Ht 69.0 in | Wt 255.0 lb

## 2023-11-09 DIAGNOSIS — E041 Nontoxic single thyroid nodule: Secondary | ICD-10-CM

## 2023-11-09 DIAGNOSIS — F439 Reaction to severe stress, unspecified: Secondary | ICD-10-CM

## 2023-11-09 DIAGNOSIS — E7800 Pure hypercholesterolemia, unspecified: Secondary | ICD-10-CM | POA: Diagnosis not present

## 2023-11-09 DIAGNOSIS — M545 Low back pain, unspecified: Secondary | ICD-10-CM

## 2023-11-09 DIAGNOSIS — I10 Essential (primary) hypertension: Secondary | ICD-10-CM

## 2023-11-09 DIAGNOSIS — M549 Dorsalgia, unspecified: Secondary | ICD-10-CM | POA: Insufficient documentation

## 2023-11-09 DIAGNOSIS — U071 COVID-19: Secondary | ICD-10-CM | POA: Diagnosis not present

## 2023-11-09 HISTORY — DX: Pure hypercholesterolemia, unspecified: E78.00

## 2023-11-09 MED ORDER — PREDNISONE 10 MG PO TABS: ORAL_TABLET | ORAL | 0 refills | Status: DC

## 2023-11-09 NOTE — Assessment & Plan Note (Signed)
 Continue valsartan /hctz 160/12.5 and amlodipine  5mg .  Blood pressure doing better.  Continue to spot check pressure.  Follow metabolic panel. Blood pressure as outlined. No changes today

## 2023-11-09 NOTE — Progress Notes (Signed)
 Patient ID: Cindy Martin, female   DOB: 1971/01/07, 53 y.o.   MRN: [id number]   Virtual Visit via video Note  I connected with Cindy Martin by a video enabled telemedicine application and verified that I am speaking with the correct person using two identifiers. Location patient: home Location provider: work  Persons participating in the virtual visit: patient, provider  The limitations, risks, security and privacy concerns of performing an evaluation and management service by video and the availability of in person appointments have been discussed. It has also been discussed with the patient that there may be a patient responsible charge related to this service. The patient expressed understanding and agreed to proceed.   Reason for visit: follow up appt   HPI: Scheduled f/u appt - follow up regarding her blood pressure and increased stress. Currently on zoloft  and buspar . Tested positive for covid yesterday. Reports symptoms started 11/06/23. Reports body aches and decreased energy initially. No taste. Increased nasal congestion and chest congestion. Increased drainage. Some green yellow mucus production. Some sob when gets up to walk around. On questioning, she feels more fatigued. No sob with talking or sitting. No vomiting or diarrhea. Eating. Staying hydrated. Reports feels hot and then cold. Unsure if fever. Blood pressure 128/81. She is having increased back pain. Seeing surgery. Going to PT. Taking robaxin and tramadol.    ROS: See pertinent positives and negatives per HPI.  Past Medical History:  Diagnosis Date   Chicken pox    Hypertension    Menorrhagia     Past Surgical History:  Procedure Laterality Date   ABDOMINAL HYSTERECTOMY     COLONOSCOPY WITH PROPOFOL  N/A 10/24/2021   Procedure: COLONOSCOPY WITH PROPOFOL ;  Surgeon: Luke Salaam, MD;  Location: [facility] ENDOSCOPY;  Service: Gastroenterology;  Laterality: N/A;   CYSTOSCOPY N/A 01/30/2020   Procedure: CYSTOSCOPY;  Surgeon:  Alben Alma, MD;  Location: [facility] ORS;  Service: Gynecology;  Laterality: N/A;   TOTAL LAPAROSCOPIC HYSTERECTOMY WITH SALPINGECTOMY Bilateral 01/30/2020   Procedure: TOTAL LAPAROSCOPIC HYSTERECTOMY WITH RIGHT SALPINGECTOMY;  Surgeon: Alben Alma, MD;  Location: [facility] ORS;  Service: Gynecology;  Laterality: Bilateral;   TUBAL LIGATION     Tube reversal      Family History  Problem Relation Age of Onset   Stroke Mother    Hypertension Mother    Arthritis Mother    Hypertension Sister    Asthma Daughter    Breast cancer Paternal Grandmother    Ovarian cancer Neg Hx     SOCIAL HX: reviewed.    Current Outpatient Medications:    predniSONE  (DELTASONE ) 10 MG tablet, Take 4 tablets x 1 day and then decrease by 1/2 tablet per day until down to zero mg., Disp: 18 tablet, Rfl: 0   traMADol (ULTRAM) 50 MG tablet, Take 50 mg by mouth every 6 (six) hours as needed., Disp: , Rfl:    amLODipine  (NORVASC ) 5 MG tablet, Take 1 tablet (5 mg total) by mouth daily., Disp: 90 tablet, Rfl: 1   aspirin  81 MG EC tablet, Take 1 tablet (81 mg total) by mouth daily. Swallow whole., Disp: 30 tablet, Rfl: 0   busPIRone  (BUSPAR ) 5 MG tablet, Take 1 tablet (5 mg total) by mouth 2 (two) times daily., Disp: 180 tablet, Rfl: 1   Magnesium 250 MG TABS, Take 250 mg by mouth daily., Disp: , Rfl:    methocarbamol (ROBAXIN) 750 MG tablet, Take 750 mg by mouth 3 (three) times daily as needed., Disp: , Rfl:  sertraline  (ZOLOFT ) 100 MG tablet, TAKE 1 TABLET BY MOUTH EVERY DAY, Disp: 90 tablet, Rfl: 2   valsartan -hydrochlorothiazide  (DIOVAN -HCT) 160-12.5 MG tablet, TAKE 1 TABLET BY MOUTH EVERY DAY, Disp: 90 tablet, Rfl: 1  EXAM:  VITALS per patient if applicable: 128/81  GENERAL: alert, oriented, appears well and in no acute distress  HEENT: atraumatic, conjunttiva clear, no obvious abnormalities on inspection of external nose and ears  NECK: normal movements of the head and neck  LUNGS: on inspection no  signs of respiratory distress, breathing rate appears normal, no obvious gross SOB, gasping or wheezing. Increased cough with forced expiration.   CV: no obvious cyanosis  PSYCH/NEURO: pleasant and cooperative, no obvious depression or anxiety, speech and thought processing grossly intact  ASSESSMENT AND PLAN:  Discussed the following assessment and plan:  Problem List Items Addressed This Visit     Back pain   Continue f/u with NSU.       Relevant Medications   methocarbamol (ROBAXIN) 750 MG tablet   traMADol (ULTRAM) 50 MG tablet   predniSONE  (DELTASONE ) 10 MG tablet   COVID-19 virus infection - Primary   Symptoms as outlined. Started 11/06/23. Tested positive 11/07/23. Symptoms as outlined. Discussed antivirals. Elects to hold on antiviral medication. Nasacort  nasal spray and saline nasal spray as directed. Robitussin DM. Prednisone  taper. Rest. Fluids.  Call with update.  Out of work this week. Discussed quarantine recommendations.       Hypercholesteremia   Low cholesterol diet and exercise.  Follow lipid panel.       Hypertension, essential   Continue valsartan /hctz 160/12.5 and amlodipine  5mg .  Blood pressure doing better.  Continue to spot check pressure.  Follow metabolic panel. Blood pressure as outlined. No changes today       Stress   Handling things well. Continues on zoloft . Discussed possible interaction with tramadol. Follow.       Thyroid  nodule    S/p thyroid  biopsy - benign (evaluated 01/2022).  Recommended f/u thyroid  ultrasound and appt in one year. Needs to reschedule appt. Have previously discussed.        Return in about 4 months (around 03/10/2024) for physical. after 03/15/24. schedule fasting labs in 2-3 weeks. .   I discussed the assessment and treatment plan with the patient. The patient was provided an opportunity to ask questions and all were answered. The patient agreed with the plan and demonstrated an understanding of the instructions.   The  patient was advised to call back or seek an in-person evaluation if the symptoms worsen or if the condition fails to improve as anticipated.    Dellar Fenton, MD

## 2023-11-09 NOTE — Assessment & Plan Note (Signed)
 Symptoms as outlined. Started 11/06/23. Tested positive 11/07/23. Symptoms as outlined. Discussed antivirals. Elects to hold on antiviral medication. Nasacort  nasal spray and saline nasal spray as directed. Robitussin DM. Prednisone  taper. Rest. Fluids.  Call with update.  Out of work this week. Discussed quarantine recommendations.

## 2023-11-09 NOTE — Assessment & Plan Note (Signed)
 Low cholesterol diet and exercise.  Follow lipid panel.

## 2023-11-09 NOTE — Assessment & Plan Note (Signed)
 S/p thyroid  biopsy - benign (evaluated 01/2022).  Recommended f/u thyroid  ultrasound and appt in one year. Needs to reschedule appt. Have previously discussed.

## 2023-11-09 NOTE — Assessment & Plan Note (Signed)
 Continue f/u with NSU.

## 2023-11-09 NOTE — Assessment & Plan Note (Signed)
 Handling things well. Continues on zoloft . Discussed possible interaction with tramadol. Follow.

## 2023-11-29 ENCOUNTER — Other Ambulatory Visit: Payer: Self-pay | Admitting: Neurosurgery

## 2023-11-30 ENCOUNTER — Encounter (HOSPITAL_COMMUNITY): Payer: Self-pay

## 2023-11-30 ENCOUNTER — Encounter: Payer: Self-pay | Admitting: Internal Medicine

## 2023-11-30 NOTE — Progress Notes (Signed)
 PCP - Dr Dellar Fenton Cardiologist - none  Chest x-ray - 03/31/23 EKG - 12/01/23 Stress Test - n/a ECHO - n/a Cardiac Cath - n/a  ICD Pacemaker/Loop - n/a  Sleep Study -  n/a  Diabetes - n/a  Aspirin  & Blood Thinner Instructions:  n/a  NPO   Anesthesia review: No  STOP now taking any Aspirin  (unless otherwise instructed by your surgeon), Aleve, Naproxen, Ibuprofen, Motrin, Advil, Goody's, BC's, all herbal medications, fish oil, and all vitamins.   Coronavirus Screening Do you have any of the following symptoms:  Cough yes/no: No Fever (>100.31F)  yes/no: No Runny nose yes/no: No Sore throat yes/no: No Difficulty breathing/shortness of breath  yes/no: No  Have you traveled in the last 14 days and where? yes/no: No  Patient verbalized understanding of instructions that were given to them at the PAT appointment. Patient was also instructed that they will need to review over the PAT instructions again at home before surgery.

## 2023-11-30 NOTE — Progress Notes (Signed)
 Surgical Instructions   Your procedure is scheduled on Thursday, May 15th, 2025. Report to [facility] Main Entrance "A" at 5:30 A.M., then check in with the Admitting office. Any questions or running late day of surgery: call [phone]  Questions prior to your surgery date: call [phone], Monday-Friday, 8am-4pm. If you experience any cold or flu symptoms such as cough, fever, chills, shortness of breath, etc. between now and your scheduled surgery, please notify us  at the above number.     Remember:  Do not eat or drink after midnight the night before your surgery     Take these medicines the morning of surgery with A SIP OF WATER : Amlodipine  (Norvasc ) Buspirone  (Buspar ) Sertraline  (Zoloft )   May take these medicines IF NEEDED: Methocarbamol (Robaxin)    One week prior to surgery, STOP taking any Aspirin  (unless otherwise instructed by your surgeon) Aleve, Naproxen, Ibuprofen, Motrin, Advil, Goody's, BC's, all herbal medications, fish oil, and non-prescription vitamins.                     Do NOT Smoke (Tobacco/Vaping) for 24 hours prior to your procedure.  If you use a CPAP at night, you may bring your mask/headgear for your overnight stay.   You will be asked to remove any contacts, glasses, piercing's, hearing aid's, dentures/partials prior to surgery. Please bring cases for these items if needed.    Patients discharged the day of surgery will not be allowed to drive home, and someone needs to stay with them for 24 hours.  SURGICAL WAITING ROOM VISITATION Patients may have no more than 2 support people in the waiting area - these visitors may rotate.   Pre-op nurse will coordinate an appropriate time for 1 ADULT support person, who may not rotate, to accompany patient in pre-op.  Children under the age of 42 must have an adult with them who is not the patient and must remain in the main waiting area with an adult.  If the patient needs to stay at the hospital  during part of their recovery, the visitor guidelines for inpatient rooms apply.  Please refer to the [facility] website for the visitor guidelines for any additional information.   If you received a COVID test during your pre-op visit  it is requested that you wear a mask when out in public, stay away from anyone that may not be feeling well and notify your surgeon if you develop symptoms. If you have been in contact with anyone that has tested positive in the last 10 days please notify you surgeon.      Pre-operative CHG Bathing Instructions   You can play a key role in reducing the risk of infection after surgery. Your skin needs to be as free of germs as possible. You can reduce the number of germs on your skin by washing with CHG (chlorhexidine  gluconate) soap before surgery. CHG is an antiseptic soap that kills germs and continues to kill germs even after washing.   DO NOT use if you have an allergy to chlorhexidine /CHG or antibacterial soaps. If your skin becomes reddened or irritated, stop using the CHG and notify one of our RNs at [phone].              TAKE A SHOWER THE NIGHT BEFORE SURGERY AND THE DAY OF SURGERY    Please keep in mind the following:  DO NOT shave, including legs and underarms, 48 hours prior to surgery.   You may shave your  face before/day of surgery.  Place clean sheets on your bed the night before surgery Use a clean washcloth (not used since being washed) for each shower. DO NOT sleep with pet's night before surgery.  CHG Shower Instructions:  Wash your face and private area with normal soap. If you choose to wash your hair, wash first with your normal shampoo.  After you use shampoo/soap, rinse your hair and body thoroughly to remove shampoo/soap residue.  Turn the water  OFF and apply half the bottle of CHG soap to a CLEAN washcloth.  Apply CHG soap ONLY FROM YOUR NECK DOWN TO YOUR TOES (washing for 3-5 minutes)  DO NOT use CHG soap on face, private  areas, open wounds, or sores.  Pay special attention to the area where your surgery is being performed.  If you are having back surgery, having someone wash your back for you may be helpful. Wait 2 minutes after CHG soap is applied, then you may rinse off the CHG soap.  Pat dry with a clean towel  Put on clean pajamas    Additional instructions for the day of surgery: DO NOT APPLY any lotions, deodorants, cologne, or perfumes.   Do not wear jewelry or makeup Do not wear nail polish, gel polish, artificial nails, or any other type of covering on natural nails (fingers and toes) Do not bring valuables to the hospital. [facility] is not responsible for valuables/personal belongings. Put on clean/comfortable clothes.  Please brush your teeth.  Ask your nurse before applying any prescription medications to the skin.

## 2023-11-30 NOTE — Telephone Encounter (Signed)
 Patient states she is doing well as discussed with you and she does not have any concerns at this time.

## 2023-11-30 NOTE — Telephone Encounter (Signed)
 Noted. Please confirm doing ok as we discussed.

## 2023-12-01 ENCOUNTER — Other Ambulatory Visit: Payer: Self-pay

## 2023-12-01 ENCOUNTER — Encounter (HOSPITAL_COMMUNITY): Payer: Self-pay

## 2023-12-01 ENCOUNTER — Encounter (HOSPITAL_COMMUNITY)
Admission: RE | Admit: 2023-12-01 | Discharge: 2023-12-01 | Disposition: A | Discharge status: Home / Self Care | Source: Ambulatory Visit | Attending: Neurosurgery | Admitting: Neurosurgery

## 2023-12-01 VITALS — BP 126/101 | HR 83 | Temp 98.2°F | Resp 16 | Ht 69.0 in | Wt 273.0 lb

## 2023-12-01 DIAGNOSIS — Z01818 Encounter for other preprocedural examination: Secondary | ICD-10-CM | POA: Diagnosis present

## 2023-12-01 LAB — BASIC METABOLIC PANEL WITH GFR
Anion gap: 8 (ref 5–15)
BUN: 20 mg/dL (ref 6–20)
CO2: 23 mmol/L (ref 22–32)
Calcium: 9.2 mg/dL (ref 8.9–10.3)
Chloride: 108 mmol/L (ref 98–111)
Creatinine, Ser: 0.84 mg/dL (ref 0.44–1.00)
GFR, Estimated: >60 mL/min (ref >60)
Glucose, Bld: 109 mg/dL — ABNORMAL HIGH (ref 70–99)
Potassium: 3.6 mmol/L (ref 3.5–5.1)
Sodium: 139 mmol/L (ref 135–145)

## 2023-12-01 LAB — CBC
HCT: 45.5 % (ref 36.0–46.0)
Hemoglobin: 15.1 g/dL — ABNORMAL HIGH (ref 12.0–15.0)
MCH: 30.8 pg (ref 26.0–34.0)
MCHC: 33.2 g/dL (ref 30.0–36.0)
MCV: 92.9 fL (ref 80.0–100.0)
Platelets: 251 10*3/uL (ref 150–400)
RBC: 4.9 MIL/uL (ref 3.87–5.11)
RDW: 13.4 % (ref 11.5–15.5)
WBC: 5.5 10*3/uL (ref 4.0–10.5)
nRBC: 0 % (ref 0.0–0.2)

## 2023-12-01 LAB — SURGICAL PCR SCREEN
MRSA, PCR: NEGATIVE
Staphylococcus aureus: NEGATIVE

## 2023-12-01 LAB — TYPE AND SCREEN
ABO/RH(D): O POS
Antibody Screen: NEGATIVE

## 2023-12-01 NOTE — Anesthesia Preprocedure Evaluation (Signed)
 Anesthesia Evaluation  Patient identified by MRN, date of birth, ID band Patient awake    Reviewed: Allergy & Precautions, NPO status , Patient's Chart, lab work & pertinent test results  History of Anesthesia Complications Negative for: history of anesthetic complications  Airway Mallampati: I  TM Distance: >3 FB Neck ROM: Full   Comment: Previous grade II view with McGrath 3, easy mask Dental  (+) Dental Advisory Given   Pulmonary neg shortness of breath, neg sleep apnea, neg COPD, Recent URI  (COVID+ 11/08/2023), Resolved   Pulmonary exam normal breath sounds clear to auscultation       Cardiovascular hypertension (amlodipine , valsartan -HCTZ), Pt. on medications (-) angina (-) Past MI, (-) Cardiac Stents and (-) CABG (-) dysrhythmias  Rhythm:Regular Rate:Normal  HLD   Neuro/Psych  Headaches, neg Seizures  Neuromuscular disease (lumbar spondylolisthesis)    GI/Hepatic negative GI ROS, Neg liver ROS,,,  Endo/Other  neg diabetes  Class 3 obesityThyroid nodule  Renal/GU negative Renal ROS     Musculoskeletal   Abdominal   Peds  Hematology negative hematology ROS (+) Lab Results      Component                Value               Date                      WBC                      5.5                 12/01/2023                HGB                      15.1 (H)            12/01/2023                HCT                      45.5                12/01/2023                MCV                      92.9                12/01/2023                PLT                      251                 12/01/2023              Anesthesia Other Findings   Reproductive/Obstetrics                             Anesthesia Physical Anesthesia Plan  ASA: 3  Anesthesia Plan: General   Post-op Pain Management: Tylenol  PO (pre-op)*, Dilaudid IV and Ketamine IV*   Induction: Intravenous  PONV Risk Score and Plan: 3 and  Ondansetron , Dexamethasone , Midazolam  and Treatment may vary due to age or medical condition  Airway Management  Planned: Oral ETT  Additional Equipment:   Intra-op Plan:   Post-operative Plan: Extubation in OR  Informed Consent: I have reviewed the patients History and Physical, chart, labs and discussed the procedure including the risks, benefits and alternatives for the proposed anesthesia with the patient or authorized representative who has indicated his/her understanding and acceptance.     Dental advisory given  Plan Discussed with: CRNA and Anesthesiologist  Anesthesia Plan Comments: (Risks of general anesthesia discussed including, but not limited to, sore throat, hoarse voice, chipped/damaged teeth, injury to vocal cords, nausea and vomiting, allergic reactions, lung infection, heart attack, stroke, and death. All questions answered. )        Anesthesia Quick Evaluation

## 2023-12-02 ENCOUNTER — Ambulatory Visit (HOSPITAL_COMMUNITY)

## 2023-12-02 ENCOUNTER — Ambulatory Visit (HOSPITAL_COMMUNITY)
Admission: RE | Admit: 2023-12-02 | Discharge: 2023-12-04 | Disposition: A | Discharge status: Home / Self Care | Attending: Neurosurgery | Admitting: Neurosurgery

## 2023-12-02 ENCOUNTER — Ambulatory Visit (HOSPITAL_COMMUNITY): Payer: Self-pay | Admitting: Anesthesiology

## 2023-12-02 ENCOUNTER — Ambulatory Visit (HOSPITAL_BASED_OUTPATIENT_CLINIC_OR_DEPARTMENT_OTHER): Payer: Self-pay | Admitting: Anesthesiology

## 2023-12-02 ENCOUNTER — Ambulatory Visit (HOSPITAL_COMMUNITY)
Admission: RE | Disposition: A | Discharge status: Home / Self Care | Payer: Self-pay | Source: Home / Self Care | Attending: Neurosurgery

## 2023-12-02 ENCOUNTER — Other Ambulatory Visit: Payer: Self-pay

## 2023-12-02 ENCOUNTER — Encounter (HOSPITAL_COMMUNITY): Payer: Self-pay | Admitting: Neurosurgery

## 2023-12-02 DIAGNOSIS — Z79899 Other long term (current) drug therapy: Secondary | ICD-10-CM | POA: Insufficient documentation

## 2023-12-02 DIAGNOSIS — M48062 Spinal stenosis, lumbar region with neurogenic claudication: Secondary | ICD-10-CM | POA: Insufficient documentation

## 2023-12-02 DIAGNOSIS — Z8616 Personal history of COVID-19: Secondary | ICD-10-CM | POA: Insufficient documentation

## 2023-12-02 DIAGNOSIS — Z6841 Body Mass Index (BMI) 40.0 and over, adult: Secondary | ICD-10-CM | POA: Insufficient documentation

## 2023-12-02 DIAGNOSIS — I10 Essential (primary) hypertension: Secondary | ICD-10-CM | POA: Insufficient documentation

## 2023-12-02 DIAGNOSIS — E66813 Obesity, class 3: Secondary | ICD-10-CM | POA: Insufficient documentation

## 2023-12-02 DIAGNOSIS — M5117 Intervertebral disc disorders with radiculopathy, lumbosacral region: Secondary | ICD-10-CM

## 2023-12-02 DIAGNOSIS — M4317 Spondylolisthesis, lumbosacral region: Secondary | ICD-10-CM | POA: Diagnosis present

## 2023-12-02 SURGERY — POSTERIOR LUMBAR FUSION 1 LEVEL
Anesthesia: General | Site: Spine Lumbar

## 2023-12-02 MED ORDER — THROMBIN 5000 UNITS EX KIT
PACK | CUTANEOUS | Status: AC
  Filled 2023-12-02: qty 1

## 2023-12-02 MED ORDER — LIDOCAINE 2% (20 MG/ML) 5 ML SYRINGE
INTRAMUSCULAR | Status: DC | PRN
Start: 2023-12-02 — End: 2023-12-02
  Administered 2023-12-02: 100 mg via INTRAVENOUS

## 2023-12-02 MED ORDER — ACETAMINOPHEN 500 MG PO TABS
1000.0000 mg | ORAL_TABLET | Freq: Once | ORAL | Status: AC
  Administered 2023-12-02: 1000 mg via ORAL
  Filled 2023-12-02: qty 2

## 2023-12-02 MED ORDER — FENTANYL CITRATE (PF) 250 MCG/5ML IJ SOLN
INTRAMUSCULAR | Status: AC
  Filled 2023-12-02: qty 5

## 2023-12-02 MED ORDER — BACITRACIN ZINC 500 UNIT/GM EX OINT
TOPICAL_OINTMENT | CUTANEOUS | Status: DC | PRN
  Administered 2023-12-02: 1 via TOPICAL

## 2023-12-02 MED ORDER — CHLORHEXIDINE GLUCONATE CLOTH 2 % EX PADS: 6.0000 | MEDICATED_PAD | Freq: Once | CUTANEOUS | Status: DC

## 2023-12-02 MED ORDER — MIDAZOLAM HCL 2 MG/2ML IJ SOLN
INTRAMUSCULAR | Status: DC | PRN
  Administered 2023-12-02: 2 mg via INTRAVENOUS

## 2023-12-02 MED ORDER — GABAPENTIN 100 MG PO CAPS
100.0000 mg | ORAL_CAPSULE | Freq: Three times a day (TID) | ORAL | Status: DC
  Administered 2023-12-02: 100 mg via ORAL
  Filled 2023-12-02: qty 1

## 2023-12-02 MED ORDER — BUPIVACAINE LIPOSOME 1.3 % IJ SUSP
INTRAMUSCULAR | Status: DC | PRN
  Administered 2023-12-02: 20 mL

## 2023-12-02 MED ORDER — PHENYLEPHRINE 80 MCG/ML (10ML) SYRINGE FOR IV PUSH (FOR BLOOD PRESSURE SUPPORT)
PREFILLED_SYRINGE | INTRAVENOUS | Status: AC
  Filled 2023-12-02: qty 10

## 2023-12-02 MED ORDER — ACETAMINOPHEN 10 MG/ML IV SOLN
INTRAVENOUS | Status: AC
  Filled 2023-12-02: qty 100

## 2023-12-02 MED ORDER — DEXAMETHASONE SODIUM PHOSPHATE 4 MG/ML IJ SOLN
4.0000 mg | Freq: Four times a day (QID) | INTRAMUSCULAR | Status: DC
  Administered 2023-12-02 – 2023-12-03 (×3): 4 mg via INTRAVENOUS
  Filled 2023-12-02 (×3): qty 1

## 2023-12-02 MED ORDER — DEXAMETHASONE SODIUM PHOSPHATE 10 MG/ML IJ SOLN
INTRAMUSCULAR | Status: DC | PRN
  Administered 2023-12-02: 10 mg via INTRAVENOUS

## 2023-12-02 MED ORDER — KETAMINE HCL 50 MG/5ML IJ SOSY
PREFILLED_SYRINGE | INTRAMUSCULAR | Status: AC
  Filled 2023-12-02: qty 5

## 2023-12-02 MED ORDER — PROPOFOL 10 MG/ML IV BOLUS
INTRAVENOUS | Status: AC
  Filled 2023-12-02: qty 20

## 2023-12-02 MED ORDER — BUPIVACAINE LIPOSOME 1.3 % IJ SUSP
INTRAMUSCULAR | Status: AC
  Filled 2023-12-02: qty 20

## 2023-12-02 MED ORDER — BACITRACIN ZINC 500 UNIT/GM EX OINT
TOPICAL_OINTMENT | CUTANEOUS | Status: AC
  Filled 2023-12-02: qty 28.35

## 2023-12-02 MED ORDER — ORAL CARE MOUTH RINSE: 15.0000 mL | Freq: Once | OROMUCOSAL | Status: AC

## 2023-12-02 MED ORDER — ONDANSETRON HCL 4 MG/2ML IJ SOLN
INTRAMUSCULAR | Status: AC
  Filled 2023-12-02: qty 2

## 2023-12-02 MED ORDER — ROCURONIUM BROMIDE 10 MG/ML (PF) SYRINGE
PREFILLED_SYRINGE | INTRAVENOUS | Status: DC | PRN
  Administered 2023-12-02: 30 mg via INTRAVENOUS
  Administered 2023-12-02: 20 mg via INTRAVENOUS
  Administered 2023-12-02: 10 mg via INTRAVENOUS
  Administered 2023-12-02: 70 mg via INTRAVENOUS
  Administered 2023-12-02: 20 mg via INTRAVENOUS

## 2023-12-02 MED ORDER — SODIUM CHLORIDE 0.9% FLUSH
3.0000 mL | Freq: Two times a day (BID) | INTRAVENOUS | Status: DC
  Administered 2023-12-02: 3 mL via INTRAVENOUS

## 2023-12-02 MED ORDER — SODIUM CHLORIDE 0.9% FLUSH: 3.0000 mL | INTRAVENOUS | Status: DC | PRN

## 2023-12-02 MED ORDER — SENNA 8.6 MG PO TABS
1.0000 | ORAL_TABLET | Freq: Two times a day (BID) | ORAL | Status: DC | PRN
  Administered 2023-12-02 – 2023-12-03 (×2): 8.6 mg via ORAL
  Filled 2023-12-02 (×2): qty 1

## 2023-12-02 MED ORDER — MORPHINE SULFATE (PF) 2 MG/ML IV SOLN: 2.0000 mg | INTRAVENOUS | Status: DC | PRN

## 2023-12-02 MED ORDER — FENTANYL CITRATE (PF) 250 MCG/5ML IJ SOLN
INTRAMUSCULAR | Status: DC | PRN
  Administered 2023-12-02 (×3): 50 ug via INTRAVENOUS
  Administered 2023-12-02: 100 ug via INTRAVENOUS

## 2023-12-02 MED ORDER — ROCURONIUM BROMIDE 10 MG/ML (PF) SYRINGE
PREFILLED_SYRINGE | INTRAVENOUS | Status: AC
  Filled 2023-12-02: qty 10

## 2023-12-02 MED ORDER — ACETAMINOPHEN 10 MG/ML IV SOLN
1000.0000 mg | Freq: Once | INTRAVENOUS | Status: DC | PRN
  Administered 2023-12-02: 1000 mg via INTRAVENOUS

## 2023-12-02 MED ORDER — VALSARTAN-HYDROCHLOROTHIAZIDE 160-12.5 MG PO TABS
1.0000 | ORAL_TABLET | Freq: Every day | ORAL | Status: DC
Start: 2023-12-02 — End: 2023-12-02

## 2023-12-02 MED ORDER — SODIUM CHLORIDE 0.9 % IV SOLN
250.0000 mL | INTRAVENOUS | Status: AC
  Administered 2023-12-02: 250 mL via INTRAVENOUS

## 2023-12-02 MED ORDER — ONDANSETRON HCL 4 MG/2ML IJ SOLN
INTRAMUSCULAR | Status: DC | PRN
  Administered 2023-12-02: 4 mg via INTRAVENOUS

## 2023-12-02 MED ORDER — MIDAZOLAM HCL 2 MG/2ML IJ SOLN
INTRAMUSCULAR | Status: AC
  Filled 2023-12-02: qty 2

## 2023-12-02 MED ORDER — MENTHOL 3 MG MT LOZG: 1.0000 | LOZENGE | OROMUCOSAL | Status: DC | PRN

## 2023-12-02 MED ORDER — DEXAMETHASONE SODIUM PHOSPHATE 10 MG/ML IJ SOLN
INTRAMUSCULAR | Status: AC
Start: 2023-12-02 — End: ?
  Filled 2023-12-02: qty 1

## 2023-12-02 MED ORDER — PHENYLEPHRINE HCL (PRESSORS) 10 MG/ML IV SOLN
INTRAVENOUS | Status: AC
  Filled 2023-12-02: qty 1

## 2023-12-02 MED ORDER — 0.9 % SODIUM CHLORIDE (POUR BTL) OPTIME
TOPICAL | Status: DC | PRN
  Administered 2023-12-02: 1000 mL

## 2023-12-02 MED ORDER — BUSPIRONE HCL 5 MG PO TABS
5.0000 mg | ORAL_TABLET | Freq: Two times a day (BID) | ORAL | Status: DC
  Filled 2023-12-02 (×2): qty 1

## 2023-12-02 MED ORDER — PHENYLEPHRINE HCL-NACL 20-0.9 MG/250ML-% IV SOLN
INTRAVENOUS | Status: DC | PRN
Start: 2023-12-02 — End: 2023-12-02
  Administered 2023-12-02: 10 ug/min via INTRAVENOUS

## 2023-12-02 MED ORDER — ACETAMINOPHEN 650 MG RE SUPP: 650.0000 mg | RECTAL | Status: DC | PRN

## 2023-12-02 MED ORDER — AMISULPRIDE (ANTIEMETIC) 5 MG/2ML IV SOLN: 10.0000 mg | Freq: Once | INTRAVENOUS | Status: DC | PRN

## 2023-12-02 MED ORDER — BUPIVACAINE-EPINEPHRINE (PF) 0.5% -1:200000 IJ SOLN
INTRAMUSCULAR | Status: DC | PRN
  Administered 2023-12-02: 10 mL

## 2023-12-02 MED ORDER — VASHE WOUND IRRIGATION OPTIME
TOPICAL | Status: DC | PRN
  Administered 2023-12-02: 34 [oz_av]

## 2023-12-02 MED ORDER — PHENOL 1.4 % MT LIQD: 1.0000 | OROMUCOSAL | Status: DC | PRN

## 2023-12-02 MED ORDER — HYDROMORPHONE HCL 1 MG/ML IJ SOLN
INTRAMUSCULAR | Status: AC
  Filled 2023-12-02: qty 1

## 2023-12-02 MED ORDER — CHLORHEXIDINE GLUCONATE 0.12 % MT SOLN
15.0000 mL | Freq: Once | OROMUCOSAL | Status: AC
  Administered 2023-12-02: 15 mL via OROMUCOSAL
  Filled 2023-12-02: qty 15

## 2023-12-02 MED ORDER — ACETAMINOPHEN 500 MG PO TABS
1000.0000 mg | ORAL_TABLET | Freq: Four times a day (QID) | ORAL | Status: AC
  Administered 2023-12-02 – 2023-12-03 (×3): 1000 mg via ORAL
  Filled 2023-12-02 (×3): qty 2

## 2023-12-02 MED ORDER — OXYCODONE HCL 5 MG PO TABS
10.0000 mg | ORAL_TABLET | ORAL | Status: DC | PRN
  Administered 2023-12-02 (×2): 10 mg via ORAL
  Filled 2023-12-02 (×2): qty 2

## 2023-12-02 MED ORDER — KETAMINE HCL 10 MG/ML IJ SOLN
INTRAMUSCULAR | Status: DC | PRN
Start: 2023-12-02 — End: 2023-12-02
  Administered 2023-12-02 (×2): 25 mg via INTRAVENOUS

## 2023-12-02 MED ORDER — SUGAMMADEX SODIUM 200 MG/2ML IV SOLN
INTRAVENOUS | Status: DC | PRN
  Administered 2023-12-02: 200 mg via INTRAVENOUS

## 2023-12-02 MED ORDER — ONDANSETRON HCL 4 MG/2ML IJ SOLN: 4.0000 mg | Freq: Four times a day (QID) | INTRAMUSCULAR | Status: DC | PRN

## 2023-12-02 MED ORDER — BUSPIRONE HCL 5 MG PO TABS
5.0000 mg | ORAL_TABLET | Freq: Two times a day (BID) | ORAL | Status: DC
  Administered 2023-12-02 – 2023-12-03 (×3): 5 mg via ORAL
  Filled 2023-12-02 (×4): qty 1

## 2023-12-02 MED ORDER — DOCUSATE SODIUM 100 MG PO CAPS
100.0000 mg | ORAL_CAPSULE | Freq: Two times a day (BID) | ORAL | Status: DC
  Administered 2023-12-02 – 2023-12-04 (×5): 100 mg via ORAL
  Filled 2023-12-02 (×5): qty 1

## 2023-12-02 MED ORDER — HYDROMORPHONE HCL 1 MG/ML IJ SOLN
0.2500 mg | INTRAMUSCULAR | Status: DC | PRN
  Administered 2023-12-02 (×2): 0.5 mg via INTRAVENOUS

## 2023-12-02 MED ORDER — ZOLPIDEM TARTRATE 5 MG PO TABS: 5.0000 mg | ORAL_TABLET | Freq: Every evening | ORAL | Status: DC | PRN

## 2023-12-02 MED ORDER — PROPOFOL 10 MG/ML IV BOLUS
INTRAVENOUS | Status: DC | PRN
  Administered 2023-12-02: 200 mg via INTRAVENOUS

## 2023-12-02 MED ORDER — CEFAZOLIN SODIUM-DEXTROSE 2-4 GM/100ML-% IV SOLN
2.0000 g | Freq: Three times a day (TID) | INTRAVENOUS | Status: AC
  Administered 2023-12-02 (×2): 2 g via INTRAVENOUS
  Filled 2023-12-02 (×2): qty 100

## 2023-12-02 MED ORDER — AMLODIPINE BESYLATE 5 MG PO TABS
5.0000 mg | ORAL_TABLET | Freq: Every day | ORAL | Status: DC
  Administered 2023-12-02 – 2023-12-03 (×2): 5 mg via ORAL
  Filled 2023-12-02 (×3): qty 1

## 2023-12-02 MED ORDER — SERTRALINE HCL 100 MG PO TABS
100.0000 mg | ORAL_TABLET | Freq: Every day | ORAL | Status: DC
  Administered 2023-12-03: 100 mg via ORAL
  Filled 2023-12-02: qty 1

## 2023-12-02 MED ORDER — OXYCODONE HCL 5 MG PO TABS: 5.0000 mg | ORAL_TABLET | ORAL | Status: DC | PRN

## 2023-12-02 MED ORDER — CEFAZOLIN SODIUM-DEXTROSE 3-4 GM/150ML-% IV SOLN
3.0000 g | INTRAVENOUS | Status: AC
  Administered 2023-12-02: 3 g via INTRAVENOUS
  Filled 2023-12-02: qty 150

## 2023-12-02 MED ORDER — PHENYLEPHRINE 80 MCG/ML (10ML) SYRINGE FOR IV PUSH (FOR BLOOD PRESSURE SUPPORT)
PREFILLED_SYRINGE | INTRAVENOUS | Status: DC | PRN
  Administered 2023-12-02 (×2): 80 ug via INTRAVENOUS

## 2023-12-02 MED ORDER — IRBESARTAN 150 MG PO TABS
150.0000 mg | ORAL_TABLET | Freq: Every day | ORAL | Status: DC
  Administered 2023-12-02 – 2023-12-03 (×2): 150 mg via ORAL
  Filled 2023-12-02 (×2): qty 1

## 2023-12-02 MED ORDER — OXYCODONE HCL 5 MG/5ML PO SOLN: 5.0000 mg | Freq: Once | ORAL | Status: DC | PRN

## 2023-12-02 MED ORDER — CYCLOBENZAPRINE HCL 10 MG PO TABS
10.0000 mg | ORAL_TABLET | Freq: Three times a day (TID) | ORAL | Status: DC | PRN
  Administered 2023-12-02 – 2023-12-04 (×5): 10 mg via ORAL
  Filled 2023-12-02 (×5): qty 1

## 2023-12-02 MED ORDER — LIDOCAINE 2% (20 MG/ML) 5 ML SYRINGE
INTRAMUSCULAR | Status: AC
Start: 2023-12-02 — End: ?
  Filled 2023-12-02: qty 5

## 2023-12-02 MED ORDER — BISACODYL 10 MG RE SUPP: 10.0000 mg | Freq: Every day | RECTAL | Status: DC | PRN

## 2023-12-02 MED ORDER — ACETAMINOPHEN 325 MG PO TABS: 650.0000 mg | ORAL_TABLET | ORAL | Status: DC | PRN

## 2023-12-02 MED ORDER — OXYCODONE HCL 5 MG PO TABS: 5.0000 mg | ORAL_TABLET | Freq: Once | ORAL | Status: DC | PRN

## 2023-12-02 MED ORDER — ONDANSETRON HCL 4 MG PO TABS: 4.0000 mg | ORAL_TABLET | Freq: Four times a day (QID) | ORAL | Status: DC | PRN

## 2023-12-02 MED ORDER — HYDROCHLOROTHIAZIDE 12.5 MG PO TABS
12.5000 mg | ORAL_TABLET | Freq: Every day | ORAL | Status: DC
  Administered 2023-12-02 – 2023-12-03 (×2): 12.5 mg via ORAL
  Filled 2023-12-02 (×2): qty 1

## 2023-12-02 MED ORDER — OXYCODONE HCL 5 MG PO TABS
5.0000 mg | ORAL_TABLET | ORAL | Status: DC | PRN
  Administered 2023-12-02: 5 mg via ORAL
  Administered 2023-12-03 – 2023-12-04 (×6): 10 mg via ORAL
  Administered 2023-12-04: 5 mg via ORAL
  Administered 2023-12-04: 10 mg via ORAL
  Filled 2023-12-02 (×9): qty 2

## 2023-12-02 MED ORDER — BUPIVACAINE-EPINEPHRINE (PF) 0.5% -1:200000 IJ SOLN
INTRAMUSCULAR | Status: AC
  Filled 2023-12-02: qty 30

## 2023-12-02 MED ORDER — LACTATED RINGERS IV SOLN: INTRAVENOUS | Status: DC

## 2023-12-02 MED ORDER — THROMBIN 5000 UNITS EX SOLR: OROMUCOSAL | Status: DC | PRN

## 2023-12-02 SURGICAL SUPPLY — 63 items
BAG COUNTER SPONGE SURGICOUNT (BAG) ×1 IMPLANT
BASKET BONE COLLECTION (BASKET) ×1 IMPLANT
BENZOIN TINCTURE PRP APPL 2/3 (GAUZE/BANDAGES/DRESSINGS) ×1 IMPLANT
BIT DRILL NEURO 2X3.1 SFT TUCH (MISCELLANEOUS) IMPLANT
BLADE BONE MILL MEDIUM (MISCELLANEOUS) IMPLANT
BLADE CLIPPER SURG (BLADE) IMPLANT
BUR MATCHSTICK NEURO 3.0 LAGG (BURR) ×1 IMPLANT
BUR PRECISION FLUTE 6.0 (BURR) ×1 IMPLANT
CANISTER SUCTION 3000ML PPV (SUCTIONS) ×1 IMPLANT
CAP LOCK DLX THRD (Cap) IMPLANT
CLEANSER WND VASHE INSTL 34OZ (WOUND CARE) ×1 IMPLANT
CNTNR URN SCR LID CUP LEK RST (MISCELLANEOUS) ×1 IMPLANT
COVER BACK TABLE 60X90IN (DRAPES) ×1 IMPLANT
DRAPE C-ARM 42X72 X-RAY (DRAPES) ×2 IMPLANT
DRAPE HALF SHEET 40X57 (DRAPES) ×1 IMPLANT
DRAPE LAPAROTOMY 100X72X124 (DRAPES) ×1 IMPLANT
DRAPE SURG 17X23 STRL (DRAPES) ×1 IMPLANT
DRSG OPSITE POSTOP 4X6 (GAUZE/BANDAGES/DRESSINGS) ×1 IMPLANT
DRSG TEGADERM 2-3/8X2-3/4 SM (GAUZE/BANDAGES/DRESSINGS) IMPLANT
ELECTRODE BLDE 4.0 EZ CLN MEGD (MISCELLANEOUS) ×1 IMPLANT
ELECTRODE REM PT RTRN 9FT ADLT (ELECTROSURGICAL) ×1 IMPLANT
GAUZE 4X4 16PLY ~~LOC~~+RFID DBL (SPONGE) ×1 IMPLANT
GAUZE SPONGE 4X4 12PLY STRL (GAUZE/BANDAGES/DRESSINGS) IMPLANT
GLOVE BIO SURGEON STRL SZ 6 (GLOVE) ×1 IMPLANT
GLOVE BIO SURGEON STRL SZ8 (GLOVE) ×2 IMPLANT
GLOVE BIO SURGEON STRL SZ8.5 (GLOVE) ×2 IMPLANT
GLOVE BIOGEL PI IND STRL 6.5 (GLOVE) ×1 IMPLANT
GLOVE EXAM NITRILE XL STR (GLOVE) IMPLANT
GOWN STRL REUS W/ TWL LRG LVL3 (GOWN DISPOSABLE) ×1 IMPLANT
GOWN STRL REUS W/ TWL XL LVL3 (GOWN DISPOSABLE) ×2 IMPLANT
GOWN STRL REUS W/TWL 2XL LVL3 (GOWN DISPOSABLE) IMPLANT
HEMOSTAT POWDER KIT SURGIFOAM (HEMOSTASIS) ×1 IMPLANT
KIT BASIN OR (CUSTOM PROCEDURE TRAY) ×1 IMPLANT
KIT GRAFTMAG DEL NEURO DISP (NEUROSURGERY SUPPLIES) IMPLANT
KIT POSITION SURG JACKSON T1 (MISCELLANEOUS) ×1 IMPLANT
KIT TURNOVER KIT B (KITS) ×1 IMPLANT
NDL HYPO 21X1.5 SAFETY (NEEDLE) ×1 IMPLANT
NDL HYPO 22X1.5 SAFETY MO (MISCELLANEOUS) ×1 IMPLANT
NEEDLE HYPO 21X1.5 SAFETY (NEEDLE) ×1 IMPLANT
NEEDLE HYPO 22X1.5 SAFETY MO (MISCELLANEOUS) ×1 IMPLANT
NS IRRIG 1000ML POUR BTL (IV SOLUTION) ×1 IMPLANT
PACK LAMINECTOMY NEURO (CUSTOM PROCEDURE TRAY) ×1 IMPLANT
PAD ARMBOARD POSITIONER FOAM (MISCELLANEOUS) ×3 IMPLANT
PATTIES SURGICAL .5 X.5 (GAUZE/BANDAGES/DRESSINGS) ×1 IMPLANT
PATTIES SURGICAL .5 X1 (DISPOSABLE) IMPLANT
PATTIES SURGICAL 1X1 (DISPOSABLE) ×1 IMPLANT
PUTTY DBM 10CC CALC GRAN (Putty) IMPLANT
ROD CREO DLX CVD 6.35X40 (Rod) IMPLANT
SCREW PA CREO DLX 6.5X50 (Screw) IMPLANT
SCREW PA CREO DLX 6.5X60 (Screw) IMPLANT
SPIKE FLUID TRANSFER (MISCELLANEOUS) ×1 IMPLANT
SPONGE NEURO XRAY DETECT 1X3 (DISPOSABLE) IMPLANT
SPONGE SURGIFOAM ABS GEL 100 (HEMOSTASIS) IMPLANT
SPONGE T-LAP 4X18 ~~LOC~~+RFID (SPONGE) IMPLANT
STRIP CLOSURE SKIN 1/2X4 (GAUZE/BANDAGES/DRESSINGS) ×1 IMPLANT
SUT VIC AB 1 CT1 18XBRD ANBCTR (SUTURE) ×2 IMPLANT
SUT VIC AB 2-0 CP2 18 (SUTURE) ×2 IMPLANT
SYR 20ML LL LF (SYRINGE) IMPLANT
SYR 30ML SLIP (SYRINGE) ×1 IMPLANT
TOWEL GREEN STERILE (TOWEL DISPOSABLE) ×1 IMPLANT
TOWEL GREEN STERILE FF (TOWEL DISPOSABLE) ×1 IMPLANT
TRAY FOLEY MTR SLVR 16FR STAT (SET/KITS/TRAYS/PACK) ×1 IMPLANT
WATER STERILE IRR 1000ML POUR (IV SOLUTION) ×1 IMPLANT

## 2023-12-02 NOTE — H&P (Signed)
 Subjective: The patient is a 53 year old white female who is complaining of back and bilateral lower extremity pain consistent with neurogenic claudication/lumbosacral radiculopathy.  She was worked up with a lumbar MRI lumbar x-rays which demonstrated a lumbosacral spondylolisthesis and foraminal stenosis.  I discussed the various treatment options with her.  She has decided proceed with surgery.  Past Medical History:  Diagnosis Date   Back pain 10/05/2023   Chicken pox    COVID 11/08/2023   Hypercholesteremia 11/09/2023   Hypertension    Menorrhagia    Thyroid  nodule 01/2022   benign    Past Surgical History:  Procedure Laterality Date   COLONOSCOPY WITH PROPOFOL  N/A 10/24/2021   Procedure: COLONOSCOPY WITH PROPOFOL ;  Surgeon: Luke Salaam, MD;  Location: [facility] ENDOSCOPY;  Service: Gastroenterology;  Laterality: N/A;   CYSTOSCOPY N/A 01/30/2020   Procedure: CYSTOSCOPY;  Surgeon: Alben Alma, MD;  Location: [facility] ORS;  Service: Gynecology;  Laterality: N/A;   TOTAL LAPAROSCOPIC HYSTERECTOMY WITH SALPINGECTOMY Bilateral 01/30/2020   Procedure: TOTAL LAPAROSCOPIC HYSTERECTOMY WITH RIGHT SALPINGECTOMY;  Surgeon: Alben Alma, MD;  Location: [facility] ORS;  Service: Gynecology;  Laterality: Bilateral;   TUBAL LIGATION     Tube reversal      No Known Allergies  Social History   Tobacco Use   Smoking status: Never   Smokeless tobacco: Never  Substance Use Topics   Alcohol use: No    Family History  Problem Relation Age of Onset   Stroke Mother    Hypertension Mother    Arthritis Mother    Hypertension Sister    Asthma Daughter    Breast cancer Paternal Grandmother    Ovarian cancer Neg Hx    Prior to Admission medications   Medication Sig Start Date End Date Taking? Authorizing Provider  amLODipine  (NORVASC ) 5 MG tablet Take 1 tablet (5 mg total) by mouth daily. Patient taking differently: Take 5 mg by mouth at bedtime. 08/05/23  Yes Dellar Fenton, MD  busPIRone  (BUSPAR )  5 MG tablet Take 1 tablet (5 mg total) by mouth 2 (two) times daily. 08/05/23  Yes Dellar Fenton, MD  methocarbamol (ROBAXIN) 750 MG tablet Take 750 mg by mouth 3 (three) times daily as needed for muscle spasms.   Yes [provider]  sertraline  (ZOLOFT ) 100 MG tablet TAKE 1 TABLET BY MOUTH EVERY DAY 07/19/23  Yes Dellar Fenton, MD  valsartan -hydrochlorothiazide  (DIOVAN -HCT) 160-12.5 MG tablet TAKE 1 TABLET BY MOUTH EVERY DAY 07/27/23  Yes Dellar Fenton, MD     Review of Systems  Positive ROS: As above  All other systems have been reviewed and were otherwise negative with the exception of those mentioned in the HPI and as above.  Objective: Vital signs in last 24 hours: Temp:  [98.2 F (36.8 C)-98.4 F (36.9 C)] 98.4 F (36.9 C) (05/15 0545) Pulse Rate:  [83-89] 89 (05/15 0545) Resp:  [16-18] 18 (05/15 0545) BP: (126-136)/(99-101) 136/99 (05/15 0545) SpO2:  [96 %-97 %] 96 % (05/15 0545) Weight:  [123.8 kg] 123.8 kg (05/15 0545) Estimated body mass index is 40.32 kg/m as calculated from the following:   Height as of this encounter: 5\' 9"  (1.753 m).   Weight as of this encounter: 123.8 kg.   General Appearance: Alert Head: Normocephalic, without obvious abnormality, atraumatic Eyes: PERRL, conjunctiva/corneas clear, EOM's intact,    Ears: Normal  Throat: Normal  Neck: Supple, Back: unremarkable Lungs: Clear to auscultation bilaterally, respirations unlabored Heart: Regular rate and rhythm, no murmur, rub or gallop Abdomen:  Soft, non-tender Extremities: Extremities normal, atraumatic, no cyanosis or edema Skin: unremarkable  NEUROLOGIC:   Mental status: alert and oriented,Motor Exam - grossly normal Sensory Exam - grossly normal Reflexes:  Coordination - grossly normal Gait - grossly normal Balance - grossly normal Cranial Nerves: I: smell Not tested  II: visual acuity  OS: Normal  OD: Normal   II: visual fields Full to confrontation  II: pupils Equal,  round, reactive to light  III,VII: ptosis None  III,IV,VI: extraocular muscles  Full ROM  V: mastication Normal  V: facial light touch sensation  Normal  V,VII: corneal reflex  Present  VII: facial muscle function - upper  Normal  VII: facial muscle function - lower Normal  VIII: hearing Not tested  IX: soft palate elevation  Normal  IX,X: gag reflex Present  XI: trapezius strength  5/5  XI: sternocleidomastoid strength 5/5  XI: neck flexion strength  5/5  XII: tongue strength  Normal    Data Review Lab Results  Component Value Date   WBC 5.5 12/01/2023   HGB 15.1 (H) 12/01/2023   HCT 45.5 12/01/2023   MCV 92.9 12/01/2023   PLT 251 12/01/2023   Lab Results  Component Value Date   NA 139 12/01/2023   K 3.6 12/01/2023   CL 108 12/01/2023   CO2 23 12/01/2023   BUN 20 12/01/2023   CREATININE 0.84 12/01/2023   GLUCOSE 109 (H) 12/01/2023   No results found for: "INR", "PROTIME"  Assessment/Plan: Lumbosacral spondylolisthesis, lumbosacral foraminal stenosis, lumbosacral radiculopathy, neurogenic claudication: I have discussed the situation with the patient and her husband.  I reviewed her imaging studies with her and pointed out the abnormalities.  We have discussed the various treatment options including surgery.  I described the surgical treatment option of an L5-S1 decompression, instrumentation and fusion.  I have shown her surgical models.  I have given her a surgical pamphlet.  We have discussed the risk, benefits, alternatives, expected postoperative course, and likelihood of achieving our goals with surgery.  I have answered all her questions.  She has decided proceed with surgery.   Elder Greening 12/02/2023 7:24 AM

## 2023-12-02 NOTE — Op Note (Signed)
 Brief history: The patient is a 53 year old white female who is complaining of back and bilateral leg pain consistent with a lumbosacral radiculopathy.  She failed medical management and was worked up with a lumbar MRI lumbar x-rays which demonstrated a grade 3 spondylolisthesis and severe L5-S1 foraminal stenosis.  I discussed the various treatment options with her.  She has decided proceed with surgery.  Preoperative diagnosis: Lumbosacral spondylolisthesis, foraminal stenosis, degenerative disc disease,  lumbago; lumbar radiculopathy; neurogenic claudication  Postoperative diagnosis: The same  Procedure: L5 laminectomy/Gill procedure, L5-S1 foraminotomies/medial facetectomy to decompress the bilateral L5 and S1 nerve roots(the work required to do this was in addition to the work required to do the posterior lumbar interbody fusion because of the patient's spinal stenosis, facet arthropathy. Etc. requiring a wide decompression of the nerve roots.); posterior nonsegmental instrumentation from L5 to S1 with globus titanium pedicle screws and rods; posterior lateral arthrodesis at L5-S1 with local morselized autograft bone and Zimmer DBM.  Surgeon: Dr. Pleasant Brilliant  Asst.: Marlana Silvan, NP  Anesthesia: Gen. endotracheal  Estimated blood loss: 200 cc  Drains: Medium Hemovac drain in the epidural space.  Complications: None  Description of procedure: The patient was brought to the operating room by the anesthesia team. General endotracheal anesthesia was induced. The patient was turned to the prone position on the Wilson frame. The patient's lumbosacral region was then prepared with Betadine  scrub and Betadine  solution. Sterile drapes were applied.  I then injected the area to be incised with Marcaine  with epinephrine solution. I then used the scalpel to make a linear midline incision over the L4-5 and L5-S1 interspace. I then used electrocautery to perform a bilateral subperiosteal dissection  exposing the spinous process and lamina of L4-5 and L5-S1. We then obtained intraoperative radiograph to confirm our location. We then inserted the Verstrac retractor to provide exposure.  I began the decompression by using the high speed drill to perform laminotomies at L5-S1. We then used the Kerrison punches to remove the ligamentum flavum at L4-5 and L5-S1.  I incised the interspinous ligament at L4-5 and L5-S1 with cautery and removed the interspinous ligament with the Leksell rongeur.  I used a Leksell rongeur to remove the spinous process and the L5 lamina, i.e. perform an L5 Gill procedure.  We used the Kerrison punches to remove the medial facets at L4-5 and L5-S1. We performed wide foraminotomies about the bilateral L5 and S1 nerve roots completing the decompression.  We now turned attention to the instrumentation. Under fluoroscopic guidance we cannulated the bilateral L5 and S1 pedicles with the bone probe. We then removed the bone probe. We then tapped the pedicle with a 5.5 millimeter tap. We then removed the tap. We probed inside the tapped pedicle with a ball probe to rule out cortical breaches. We then inserted a 6.5 x 50 and 60 millimeter pedicle screw into the L5 and S1 pedicles bilaterally under fluoroscopic guidance. We then palpated along the medial aspect of the pedicles to rule out cortical breaches. There were none. The nerve roots were not injured. We then connected the unilateral pedicle screws with a lordotic rod. We compressed the construct and secured the rod in place with the caps. We then tightened the caps appropriately. This completed the instrumentation from L5-S1 bilaterally.  We now turned our attention to the posterior lateral arthrodesis at L5-S1. We used the high-speed drill to decorticate the remainder of the facets, pars, transverse process at L5-S1. We then applied a combination of local  morselized autograft bone and Zimmer DBM over these decorticated posterior  lateral structures. This completed the posterior lateral arthrodesis.  We then obtained hemostasis using bipolar electrocautery. We irrigated the wound out with vashe solution. We inspected the thecal sac and nerve roots and noted they were well decompressed. We then removed the retractor.  We injected Exparel  .  We placed a medium Hemovac drain in the epidural space and tunneled it out through a separate stab wound.  We reapproximated patient's thoracolumbar fascia with interrupted #1 Vicryl suture. We reapproximated patient's subcutaneous tissue with interrupted 2-0 Vicryl suture. The reapproximated patient's skin with Steri-Strips and benzoin. The wound was then coated with bacitracin ointment. A sterile dressing was applied. The drapes were removed. The patient was subsequently returned to the supine position where they were extubated by the anesthesia team. He was then transported to the post anesthesia care unit in stable condition. All sponge instrument and needle counts were reportedly correct at the end of this case.

## 2023-12-02 NOTE — Transfer of Care (Signed)
 Immediate Anesthesia Transfer of Care Note  Patient: Cindy Martin  Procedure(s) Performed: LUMBAR FIVE-SACRAL ONE POSTEROLATERAL ARTHRODESIS INSTRUMENTATION (Spine Lumbar)  Patient Location: PACU  Anesthesia Type:General  Level of Consciousness: drowsy  Airway & Oxygen Therapy: Patient Spontanous Breathing and Patient connected to face mask oxygen  Post-op Assessment: Report given to RN and Post -op Vital signs reviewed and stable  Post vital signs: Reviewed and stable  Last Vitals:  Vitals Value Taken Time  BP 120/72 12/02/23 1300  Temp 37 C 12/02/23 1218  Pulse 80 12/02/23 1307  Resp 27 12/02/23 1307  SpO2 97 % 12/02/23 1307  Vitals shown include unfiled device data.  Last Pain:  Vitals:   12/02/23 1245  TempSrc:   PainSc: Asleep         Complications: No notable events documented.

## 2023-12-02 NOTE — Anesthesia Postprocedure Evaluation (Signed)
 Anesthesia Post Note  Patient: Cindy Martin  Procedure(s) Performed: LUMBAR FIVE-SACRAL ONE POSTEROLATERAL ARTHRODESIS INSTRUMENTATION (Spine Lumbar)     Patient location during evaluation: PACU Anesthesia Type: General Level of consciousness: awake Pain management: pain level controlled Vital Signs Assessment: post-procedure vital signs reviewed and stable Respiratory status: spontaneous breathing, nonlabored ventilation and respiratory function stable Cardiovascular status: blood pressure returned to baseline and stable Postop Assessment: no apparent nausea or vomiting Anesthetic complications: no   No notable events documented.  Last Vitals:  Vitals:   12/02/23 1330 12/02/23 1345  BP: 115/74 119/75  Pulse: 80 78  Resp: 14 11  Temp:    SpO2: 94% 94%    Last Pain:  Vitals:   12/02/23 1314  TempSrc:   PainSc: 8     LLE Motor Response: Purposeful movement;Responds to commands (12/02/23 1345) LLE Sensation: Full sensation (12/02/23 1345) RLE Motor Response: Purposeful movement;Responds to commands (12/02/23 1345) RLE Sensation: Full sensation (12/02/23 1345)      Conard Decent

## 2023-12-02 NOTE — Anesthesia Procedure Notes (Signed)
 Procedure Name: Intubation Date/Time: 12/02/2023 7:44 AM  Performed by: Linard Reno, CRNAPre-anesthesia Checklist: Patient identified, Emergency Drugs available, Suction available and Patient being monitored Patient Re-evaluated:Patient Re-evaluated prior to induction Oxygen Delivery Method: Circle System Utilized Preoxygenation: Pre-oxygenation with 100% oxygen Induction Type: IV induction Ventilation: Mask ventilation without difficulty Laryngoscope Size: Miller and 2 Grade View: Grade I Tube type: Oral Tube size: 7.5 mm Number of attempts: 1 Airway Equipment and Method: Stylet and Oral airway Placement Confirmation: ETT inserted through vocal cords under direct vision, positive ETCO2 and breath sounds checked- equal and bilateral Secured at: 22 cm Tube secured with: Tape Dental Injury: Teeth and Oropharynx as per pre-operative assessment

## 2023-12-03 DIAGNOSIS — M4317 Spondylolisthesis, lumbosacral region: Secondary | ICD-10-CM | POA: Diagnosis not present

## 2023-12-03 MED ORDER — DEXAMETHASONE 4 MG PO TABS
4.0000 mg | ORAL_TABLET | Freq: Four times a day (QID) | ORAL | Status: AC
  Administered 2023-12-03 – 2023-12-04 (×4): 4 mg via ORAL
  Filled 2023-12-03 (×4): qty 1

## 2023-12-03 MED ORDER — DOCUSATE SODIUM 100 MG PO CAPS: 100.0000 mg | ORAL_CAPSULE | Freq: Two times a day (BID) | ORAL | 0 refills | Status: DC

## 2023-12-03 MED ORDER — DEXAMETHASONE 4 MG PO TABS: 6.0000 mg | ORAL_TABLET | Freq: Four times a day (QID) | ORAL | Status: DC

## 2023-12-03 MED ORDER — OXYCODONE-ACETAMINOPHEN 5-325 MG PO TABS: 1.0000 | ORAL_TABLET | ORAL | 0 refills | Status: DC | PRN

## 2023-12-03 MED ORDER — METHYLPREDNISOLONE 4 MG PO TBPK: ORAL_TABLET | ORAL | 0 refills | Status: DC

## 2023-12-03 MED ORDER — CYCLOBENZAPRINE HCL 10 MG PO TABS: 10.0000 mg | ORAL_TABLET | Freq: Three times a day (TID) | ORAL | 0 refills | Status: DC | PRN

## 2023-12-03 MED FILL — Thrombin For Soln 5000 Unit: CUTANEOUS | Qty: 5000 | Status: AC

## 2023-12-03 NOTE — Progress Notes (Signed)
   Providing Compassionate, Quality Care - Together   Subjective: Patient reports the numbness in her left lower extremity has improved with IV decadron . She is unable to take the gabapentin  that was recommended yesterday due to swelling so this was added to her allergies. She does get some radicular pain into her left lower extremity with standing and walking. She would like to stay one more night to work toward better pain control.  Objective: Vital signs in last 24 hours: Temp:  [98 F (36.7 C)-98.9 F (37.2 C)] 98.9 F (37.2 C) (05/16 0423) Pulse Rate:  [55-88] 68 (05/16 0636) Resp:  [11-20] 18 (05/16 0423) BP: (98-150)/(51-87) 111/68 (05/16 0636) SpO2:  [93 %-98 %] 98 % (05/16 0636)  Intake/Output from previous day: 05/15 0701 - 05/16 0700 In: 1480 [P.O.:480; I.V.:1000] Out: 370 [Urine:100; Drains:70; Blood:200] Intake/Output this shift: No intake/output data recorded.  Alert and oriented x 4 PERRLA CN II-XII grossly intact MAE, Strength and sensation intact Incision is covered with Honeycomb dressing and Steri Strips; Dressing is intact with moderate amount of sanguinous drainage.   Lab Results: Recent Labs    12/01/23 0900  WBC 5.5  HGB 15.1*  HCT 45.5  PLT 251   BMET Recent Labs    12/01/23 0900  NA 139  K 3.6  CL 108  CO2 23  GLUCOSE 109*  BUN 20  CREATININE 0.84  CALCIUM 9.2    Studies/Results: DG Lumbar Spine 2-3 Views Result Date: 12/02/2023 CLINICAL DATA:  Elective surgery. EXAM: LUMBAR SPINE - 2-3 VIEW COMPARISON:  None Available. FINDINGS: Two fluoroscopic spot views of the lumbar spine submitted from the operating room. Pedicle screws at L5 and S1. Fluoroscopy time 62.6 seconds. Dose 60 mGy IMPRESSION: Intraoperative fluoroscopy during lumbar spine surgery. Electronically Signed   By: Chadwick Colonel M.D.   On: 12/02/2023 12:53   DG C-Arm 1-60 Min-No Report Result Date: 12/02/2023 Fluoroscopy was utilized by the requesting physician.  No  radiographic interpretation.   DG C-Arm 1-60 Min-No Report Result Date: 12/02/2023 Fluoroscopy was utilized by the requesting physician.  No radiographic interpretation.   DG Lumbar Spine 1 View Result Date: 12/02/2023 CLINICAL DATA:  Elective surgery.  Intraop localization. EXAM: LUMBAR SPINE - 1 VIEW COMPARISON:  Radiograph 10/05/2023 FINDINGS: Single lateral spot view of the lumbar spine submitted from the operating room. Surgical instruments localize posteriorly at the upper aspect of L5 level. IMPRESSION: Surgical instruments localize posteriorly at the upper aspect of L5 level. Electronically Signed   By: Chadwick Colonel M.D.   On: 12/02/2023 11:20    Assessment/Plan: Patient underwent L5-S1 posterior lumbar fusion by Dr. Larrie Po on 12/02/2023.   LOS: 0 days   -Continue to encourage mobilization -Pain control -Discharge home tomorrow  I am in communication with my attending and they agree with the plan for this patient.   Henreitta Locus, DNP, AGNP-C Nurse Practitioner  [facility] [address], [place], [place] [address] P: [phone]    F: [phone]  12/03/2023, 10:13 AM

## 2023-12-03 NOTE — Discharge Instructions (Signed)

## 2023-12-03 NOTE — Evaluation (Signed)
 Physical Therapy Evaluation  Patient Details Name: Cindy Martin MRN: [id number] DOB: 09/27/1970 Today's Date: 12/03/2023  History of Present Illness  Pt is a 53 y/o female who presents s/p L5-S1 PLIF on 12/02/2023. PMH significant for HTN, thyroid  nodule, chicken pox.  Clinical Impression  Pt admitted with above diagnosis. At the time of PT eval, pt was able to demonstrate transfers and ambulation with gross CGA to supervision for safety and RW for support. Pt was educated on precautions, brace application/wearing schedule, appropriate activity progression, and car transfer. Pt currently with functional limitations due to the deficits listed below (see PT Problem List). Pt will benefit from skilled PT to increase their independence and safety with mobility to allow discharge to the venue listed below.          If plan is discharge home, recommend the following: A little help with walking and/or transfers;A little help with bathing/dressing/bathroom;Assistance with cooking/housework;Assist for transportation;Help with stairs or ramp for entrance   Can travel by private vehicle        Equipment Recommendations None recommended by PT (Pt reports she can get a RW if needed)  Recommendations for Other Services       Functional Status Assessment Patient has had a recent decline in their functional status and demonstrates the ability to make significant improvements in function in a reasonable and predictable amount of time.     Precautions / Restrictions Precautions Precautions: Fall;Back Precaution Booklet Issued: Yes (comment) Recall of Precautions/Restrictions: Intact Precaution/Restrictions Comments: Reviewed handout and pt was cued for precautions during functional mobility. Required Braces or Orthoses: Spinal Brace Spinal Brace: Lumbar corset;Applied in sitting position Restrictions Weight Bearing Restrictions Per Provider Order: No      Mobility  Bed Mobility Overal bed  mobility: Modified Independent             General bed mobility comments: HOB flat and use of rails. No assist required for log roll technique.    Transfers Overall transfer level: Needs assistance Equipment used: Rolling walker (2 wheels) Transfers: Sit to/from Stand Sit to Stand: Supervision           General transfer comment: VC's for hand placement on seated surface for safety.    Ambulation/Gait Ambulation/Gait assistance: Contact guard assist Gait Distance (Feet): 200 Feet Assistive device: Rolling walker (2 wheels) Gait Pattern/deviations: Step-through pattern, Decreased stride length, Trunk flexed Gait velocity: Decreased Gait velocity interpretation: 1.31 - 2.62 ft/sec, indicative of limited community ambulator   General Gait Details: Reliant on UE support on walker. Pt ambulating fairly well however reports pain in LLE which is limiting ability to achieve same distance she did on last walk, 6 hours prior.  Stairs            Wheelchair Mobility     Tilt Bed    Modified Rankin (Stroke Patients Only)       Balance Overall balance assessment: Needs assistance Sitting-balance support: Feet supported, No upper extremity supported Sitting balance-Leahy Scale: Fair     Standing balance support: Bilateral upper extremity supported, During functional activity, Reliant on assistive device for balance Standing balance-Leahy Scale: Poor                               Pertinent Vitals/Pain Pain Assessment Pain Assessment: Faces Faces Pain Scale: Hurts even more Pain Location: LLE Pain Descriptors / Indicators: Radiating, Shooting Pain Intervention(s): Limited activity within patient's tolerance, Monitored during session, Repositioned  Home Living Family/patient expects to be discharged to:: Private residence Living Arrangements: Spouse/significant other;Children Available Help at Discharge: Family;Available 24 hours/day (first  week) Type of Home: House Home Access: Stairs to enter Entrance Stairs-Rails: Right;Left;Can reach both Entrance Stairs-Number of Steps: 6   Home Layout: One level Home Equipment: Shower seat - built [employer] fundraiser (2 wheels)      Prior Function Prior Level of Function : Independent/Modified Independent                     Extremity/Trunk Assessment   Upper Extremity Assessment Upper Extremity Assessment: Defer to OT evaluation    Lower Extremity Assessment Lower Extremity Assessment: Generalized weakness;LLE deficits/detail LLE Deficits / Details: Mild weakness bilaterally consisent with pre-op diagnosis. Pt reports improvement in symptoms in RLE, however LLE is painful and pt reports pain is "radiating down her leg".    Cervical / Trunk Assessment Cervical / Trunk Assessment: Back Surgery  Communication   Communication Communication: No apparent difficulties    Cognition Arousal: Alert Behavior During Therapy: WFL for tasks assessed/performed   PT - Cognitive impairments: No apparent impairments                         Following commands: Intact       Cueing Cueing Techniques: Verbal cues, Gestural cues     General Comments General comments (skin integrity, edema, etc.): Drainage soaked gauze fell out of honeycomb dressing upon transition to sitting. RN present promptly to redress incision site.    Exercises     Assessment/Plan    PT Assessment Patient needs continued PT services  PT Problem List Decreased strength;Decreased activity tolerance;Decreased balance;Decreased mobility;Decreased knowledge of use of DME;Decreased knowledge of precautions;Decreased safety awareness;Pain       PT Treatment Interventions DME instruction;Gait training;Stair training;Functional mobility training;Therapeutic activities;Therapeutic exercise;Balance training;Patient/family education    PT Goals (Current goals can be found in the Care Plan section)   Acute Rehab PT Goals Patient Stated Goal: Home at d/c PT Goal Formulation: With patient/family Time For Goal Achievement: 12/10/23 Potential to Achieve Goals: Good    Frequency Min 5X/week     Co-evaluation               AM-PAC PT "6 Clicks" Mobility  Outcome Measure Help needed turning from your back to your side while in a flat bed without using bedrails?: None Help needed moving from lying on your back to sitting on the side of a flat bed without using bedrails?: None Help needed moving to and from a bed to a chair (including a wheelchair)?: A Little Help needed standing up from a chair using your arms (e.g., wheelchair or bedside chair)?: A Little Help needed to walk in hospital room?: A Little Help needed climbing 3-5 steps with a railing? : A Little 6 Click Score: 20    End of Session Equipment Utilized During Treatment: Gait belt;Back brace Activity Tolerance: Patient limited by pain Patient left: in bed;with call bell/phone within reach;with family/visitor present Nurse Communication: Mobility status PT Visit Diagnosis: Unsteadiness on feet (R26.81);Pain Pain - part of body:  (back)    Time: 1610-9604 PT Time Calculation (min) (ACUTE ONLY): 23 min   Charges:   PT Evaluation $PT Eval Low Complexity: 1 Low PT Treatments $Gait Training: 8-22 mins PT General Charges $$ ACUTE PT VISIT: 1 Visit         Simone Dubois, PT, DPT Acute Rehabilitation Services Secure Chat Preferred  Office: [phone]   Cindy Martin 12/03/2023, 1:46 PM

## 2023-12-04 DIAGNOSIS — M4317 Spondylolisthesis, lumbosacral region: Secondary | ICD-10-CM | POA: Diagnosis not present

## 2023-12-04 NOTE — Evaluation (Signed)
 Occupational Therapy Evaluation and Discharge Patient Details Name: Cindy Martin MRN: [id number] DOB: 1970-12-25 Today's Date: 12/04/2023   History of Present Illness   Pt is a 53 y/o female who presents s/p L5-S1 PLIF on 12/02/2023. PMH significant for HTN, thyroid  nodule, chicken pox.     Clinical Impressions This 53 yo female admitted and underwent above presents to acute OT with all education completed and post op back handout reviewed. No further OT needs, we will sign off.     If plan is discharge home, recommend the following:   Assistance with cooking/housework;Assist for transportation     Functional Status Assessment   Patient has had a recent decline in their functional status and demonstrates the ability to make significant improvements in function in a reasonable and predictable amount of time. (without further need for skilled OT)     Equipment Recommendations   None recommended by OT      Precautions/Restrictions   Precautions Precautions: Fall;Back Precaution Booklet Issued: Yes (comment) Recall of Precautions/Restrictions: Intact Precaution/Restrictions Comments: Reviewed handout Spinal Brace: Lumbar corset;Applied in sitting position Restrictions Weight Bearing Restrictions Per Provider Order: No     Mobility Bed Mobility Overal bed mobility: Modified Independent             General bed mobility comments: HOB flat and use of rails. No assist required for log roll technique.    Transfers Overall transfer level: Independent Equipment used: Rolling walker (2 wheels) Transfers: Sit to/from Stand Sit to Stand: Modified independent (Device/Increase time)           General transfer comment: Did not need VC's to look forward/up for sit<>stand      Balance Overall balance assessment: Needs assistance Sitting-balance support: Feet supported, No upper extremity supported Sitting balance-Leahy Scale: Good     Standing balance  support: No upper extremity supported, During functional activity Standing balance-Leahy Scale: Fair Standing balance comment: Reliant on RW for ambulation but not for static or dynamic ADL standing                           ADL either performed or assessed with clinical judgement   ADL                                         General ADL Comments: Reviewed handout and educated on use of 2 cups for mouth care, wet wipes for back peri care, building up sitting tolerance, using pillows for positioning in bed, bed mobility, and sequence of dressing. Pt cannot currenltly cross her legs to get to her feet so family will A with LB bathing and dressing until she can do this. I did recommended to her to get a long handled sponge to wash her lower legs and feet when sitting on shower seat in shower.     Vision Patient Visual Report: No change from baseline              Pertinent Vitals/Pain Pain Assessment Pain Assessment: 0-10 Pain Score: 7  Pain Location: LLE Pain Descriptors / Indicators: Radiating, Shooting Pain Intervention(s): Limited activity within patient's tolerance, Monitored during session, Repositioned, Patient requesting pain meds-RN notified     Extremity/Trunk Assessment Upper Extremity Assessment Upper Extremity Assessment: Overall WFL for tasks assessed           Communication Communication Communication: No apparent difficulties  Cognition Arousal: Alert Behavior During Therapy: WFL for tasks assessed/performed                                 Following commands: Intact       Cueing   Cueing Techniques: Verbal cues;Gestural cues              Home Living Family/patient expects to be discharged to:: Private residence Living Arrangements: Spouse/significant other;Children Available Help at Discharge: Family;Available 24 hours/day (first week, then prn/intermittently after that) Type of Home: House Home  Access: Stairs to enter [employer] of Steps: 6 Entrance Stairs-Rails: Right;Left;Can reach both Home Layout: One level     Bathroom Shower/Tub: Walk-in shower;Door   [employer] Toilet: Standard     Home Equipment: Shower seat - built [employer] fundraiser (2 wheels);Hand held shower head          Prior Functioning/Environment Prior Level of Function : Independent/Modified Independent                    OT Problem List: Decreased range of motion;Obesity;Pain;Impaired balance (sitting and/or standing)        OT Goals(Current goals can be found in the care plan section)   Acute Rehab OT Goals Patient Stated Goal: to go home today         AM-PAC OT "6 Clicks" Daily Activity     Outcome Measure Help from another person eating meals?: None Help from another person taking care of personal grooming?: None Help from another person toileting, which includes using toliet, bedpan, or urinal?: None Help from another person bathing (including washing, rinsing, drying)?: A Little Help from another person to put on and taking off regular upper body clothing?: None Help from another person to put on and taking off regular lower body clothing?: A Little 6 Click Score: 22   End of Session Equipment Utilized During Treatment: Rolling walker (2 wheels);Back brace Nurse Communication: Patient requests pain meds (pt ready to go from therapy standpoint)  Activity Tolerance: Patient tolerated treatment well Patient left: in bed  OT Visit Diagnosis: Unsteadiness on feet (R26.81);Pain Pain - Right/Left: Left Pain - part of body: Leg                Time: 0800-0820 OT Time Calculation (min): 20 min Charges:  OT General Charges $OT Visit: 1 Visit OT Evaluation $OT Eval Moderate Complexity: 1 Mod  Cathy L. OT Acute Rehabilitation Services Office [phone]    Lenox Raider 12/04/2023, 9:45 AM

## 2023-12-04 NOTE — Progress Notes (Signed)
 Patient alert and oriented, void, ambulate, VSS. Surgical site clean and dry no sign of infection. D/c instructions explain and given to the patient.

## 2023-12-04 NOTE — Discharge Summary (Signed)
 Physician Discharge Summary  Patient ID: Cindy Martin MRN: [id number] DOB/AGE: Oct 27, 1970 53 y.o.  Admit date: 12/02/2023 Discharge date: 12/04/2023  Admission Diagnoses: spondylolisthesis lumbar    Discharge Diagnoses: same   Discharged Condition: good  Hospital Course: The patient was admitted on 12/02/2023 and taken to the operating room where the patient underwent PLIF. The patient tolerated the procedure well and was taken to the recovery room and then to the floor in stable condition. The hospital course was routine. There were no complications. The wound remained clean dry and intact. Pt had appropriate back soreness. No complaints of leg pain or new N/T/W. The patient remained afebrile with stable vital signs, and tolerated a regular diet. The patient continued to increase activities, and pain was well controlled with oral pain medications.   Consults: None  Significant Diagnostic Studies:  Results for orders placed or performed during the hospital encounter of 12/01/23  Surgical pcr screen   Collection Time: 12/01/23  8:20 AM   Specimen: Nasal Mucosa; Nasal Swab  Result Value Ref Range   MRSA, PCR NEGATIVE NEGATIVE   Staphylococcus aureus NEGATIVE NEGATIVE  Type and screen   Collection Time: 12/01/23  8:25 AM  Result Value Ref Range   ABO/RH(D) O POS    Antibody Screen NEG    Sample Expiration 12/15/2023,2359    Extend sample reason      NO TRANSFUSIONS OR PREGNANCY IN THE PAST 3 MONTHS Performed at [facility], [address] [place], [place] [address]   CBC   Collection Time: 12/01/23  9:00 AM  Result Value Ref Range   WBC 5.5 4.0 - 10.5 K/uL   RBC 4.90 3.87 - 5.11 MIL/uL   Hemoglobin 15.1 (H) 12.0 - 15.0 g/dL   HCT 19.1 47.8 - 29.5 %   MCV 92.9 80.0 - 100.0 fL   MCH 30.8 26.0 - 34.0 pg   MCHC 33.2 30.0 - 36.0 g/dL   RDW 62.1 30.8 - 65.7 %   Platelets 251 150 - 400 K/uL   nRBC 0.0 0.0 - 0.2 %  Basic metabolic panel   Collection Time: 12/01/23   9:00 AM  Result Value Ref Range   Sodium 139 135 - 145 mmol/L   Potassium 3.6 3.5 - 5.1 mmol/L   Chloride 108 98 - 111 mmol/L   CO2 23 22 - 32 mmol/L   Glucose, Bld 109 (H) 70 - 99 mg/dL   BUN 20 6 - 20 mg/dL   Creatinine, Ser 8.46 0.44 - 1.00 mg/dL   Calcium 9.2 8.9 - 96.2 mg/dL   GFR, Estimated >95 >28 mL/min   Anion gap 8 5 - 15    DG Lumbar Spine 2-3 Views Result Date: 12/02/2023 CLINICAL DATA:  Elective surgery. EXAM: LUMBAR SPINE - 2-3 VIEW COMPARISON:  None Available. FINDINGS: Two fluoroscopic spot views of the lumbar spine submitted from the operating room. Pedicle screws at L5 and S1. Fluoroscopy time 62.6 seconds. Dose 60 mGy IMPRESSION: Intraoperative fluoroscopy during lumbar spine surgery. Electronically Signed   By: Chadwick Colonel M.D.   On: 12/02/2023 12:53   DG C-Arm 1-60 Min-No Report Result Date: 12/02/2023 Fluoroscopy was utilized by the requesting physician.  No radiographic interpretation.   DG C-Arm 1-60 Min-No Report Result Date: 12/02/2023 Fluoroscopy was utilized by the requesting physician.  No radiographic interpretation.   DG Lumbar Spine 1 View Result Date: 12/02/2023 CLINICAL DATA:  Elective surgery.  Intraop localization. EXAM: LUMBAR SPINE - 1 VIEW COMPARISON:  Radiograph  10/05/2023 FINDINGS: Single lateral spot view of the lumbar spine submitted from the operating room. Surgical instruments localize posteriorly at the upper aspect of L5 level. IMPRESSION: Surgical instruments localize posteriorly at the upper aspect of L5 level. Electronically Signed   By: Chadwick Colonel M.D.   On: 12/02/2023 11:20    Antibiotics:  Anti-infectives (From admission, onward)    Start     Dose/Rate Route Frequency Ordered Stop   12/02/23 1600  ceFAZolin  (ANCEF ) IVPB 2g/100 mL premix        2 g 200 mL/hr over 30 Minutes Intravenous Every 8 hours 12/02/23 1400 12/03/23 0903   12/02/23 0600  ceFAZolin  (ANCEF ) IVPB 3g/150 mL premix        3 g 300 mL/hr over 30 Minutes  Intravenous On call to O.R. 12/02/23 0538 12/02/23 0800       Discharge Exam: Blood pressure 122/79, pulse 76, temperature 97.8 F (36.6 C), temperature source Oral, resp. rate 19, height 5\' 9"  (1.753 m), weight 123.8 kg, last menstrual period 01/02/2020, SpO2 95%. Neurologic: Grossly normal Dressing dry  Discharge Medications:   Allergies as of 12/04/2023       Reactions   Gabapentin  Swelling        Medication List     STOP taking these medications    methocarbamol 750 MG tablet Commonly known as: ROBAXIN       TAKE these medications    amLODipine  5 MG tablet Commonly known as: NORVASC  Take 1 tablet (5 mg total) by mouth daily. What changed: when to take this   busPIRone  5 MG tablet Commonly known as: BUSPAR  Take 1 tablet (5 mg total) by mouth 2 (two) times daily.   cyclobenzaprine  10 MG tablet Commonly known as: FLEXERIL  Take 1 tablet (10 mg total) by mouth 3 (three) times daily as needed for muscle spasms.   docusate sodium  100 MG capsule Commonly known as: COLACE Take 1 capsule (100 mg total) by mouth 2 (two) times daily.   methylPREDNISolone  4 MG Tbpk tablet Commonly known as: MEDROL  DOSEPAK Follow instructions on packet insert.   oxyCODONE -acetaminophen  5-325 MG tablet Commonly known as: Percocet Take 1-2 tablets by mouth every 4 (four) hours as needed.   sertraline  100 MG tablet Commonly known as: ZOLOFT  TAKE 1 TABLET BY MOUTH EVERY DAY   valsartan -hydrochlorothiazide  160-12.5 MG tablet Commonly known as: DIOVAN -HCT TAKE 1 TABLET BY MOUTH EVERY DAY        Disposition: home   Final Dx: PLIF  Discharge Instructions     Call MD for:  difficulty breathing, headache or visual disturbances   Complete by: As directed    Call MD for:  difficulty breathing, headache or visual disturbances   Complete by: As directed    Call MD for:  hives   Complete by: As directed    Call MD for:  persistant nausea and vomiting   Complete by: As  directed    Call MD for:  persistant nausea and vomiting   Complete by: As directed    Call MD for:  redness, tenderness, or signs of infection (pain, swelling, redness, odor or green/yellow discharge around incision site)   Complete by: As directed    Call MD for:  redness, tenderness, or signs of infection (pain, swelling, redness, odor or green/yellow discharge around incision site)   Complete by: As directed    Call MD for:  severe uncontrolled pain   Complete by: As directed    Call MD for:  severe uncontrolled  pain   Complete by: As directed    Call MD for:  temperature >100.4   Complete by: As directed    Diet - low sodium heart healthy   Complete by: As directed    Diet - low sodium heart healthy   Complete by: As directed    Increase activity slowly   Complete by: As directed    Increase activity slowly   Complete by: As directed         Follow-up Information     Garry Kansas, MD. Go on 12/31/2023.   Specialty: Neurosurgery Why: First post op appointment with x-rays is on 12/31/2023 at 12:45 PM Contact information: [address] [place] [address] [phone]                  Signed: Isadora Mar 12/04/2023, 7:53 AM

## 2023-12-10 MED FILL — Heparin Sodium (Porcine) Inj 1000 Unit/ML: INTRAMUSCULAR | Qty: 30 | Status: AC

## 2023-12-10 MED FILL — Sodium Chloride IV Soln 0.9%: INTRAVENOUS | Qty: 1000 | Status: AC

## 2023-12-10 MED FILL — Electrolyte-R (PH 7.4) Solution: INTRAVENOUS | Qty: 1000 | Status: AC

## 2024-01-18 ENCOUNTER — Other Ambulatory Visit: Payer: Self-pay | Admitting: Internal Medicine

## 2024-01-19 ENCOUNTER — Other Ambulatory Visit: Payer: Self-pay | Admitting: Internal Medicine

## 2024-01-19 NOTE — Telephone Encounter (Signed)
 I have refilled her medication x 1. She needs a f/u appt scheduled.

## 2024-01-19 NOTE — Telephone Encounter (Signed)
Please schedule her for a follow up.

## 2024-02-10 ENCOUNTER — Other Ambulatory Visit: Payer: Self-pay | Admitting: Internal Medicine

## 2024-02-10 NOTE — Telephone Encounter (Signed)
Rx = ok'd for buspar.

## 2024-02-14 ENCOUNTER — Ambulatory Visit: Admitting: Internal Medicine

## 2024-02-14 VITALS — BP 122/88 | HR 100 | Resp 16 | Ht 69.0 in | Wt 276.0 lb

## 2024-02-14 DIAGNOSIS — E041 Nontoxic single thyroid nodule: Secondary | ICD-10-CM

## 2024-02-14 DIAGNOSIS — Z9229 Personal history of other drug therapy: Secondary | ICD-10-CM | POA: Diagnosis not present

## 2024-02-14 DIAGNOSIS — M545 Low back pain, unspecified: Secondary | ICD-10-CM | POA: Diagnosis not present

## 2024-02-14 DIAGNOSIS — I10 Essential (primary) hypertension: Secondary | ICD-10-CM

## 2024-02-14 DIAGNOSIS — E7800 Pure hypercholesterolemia, unspecified: Secondary | ICD-10-CM

## 2024-02-14 DIAGNOSIS — F439 Reaction to severe stress, unspecified: Secondary | ICD-10-CM

## 2024-02-14 MED ORDER — HYDROCHLOROTHIAZIDE 12.5 MG PO CAPS: 12.5000 mg | ORAL_CAPSULE | Freq: Every day | ORAL | 1 refills | Status: DC

## 2024-02-14 NOTE — Progress Notes (Signed)
 Subjective:    Patient ID: Cindy Martin, female    DOB: January 28, 1971, 53 y.o.   MRN: [id number]  Patient here for  Chief Complaint  Patient presents with   Medical Management of Chronic Issues    HPI Here for a scheduled follow up.  S/p posterior lumbar fusion 12/02/23 - Dr Mavis. Back is better. Does report noticing some numbness right thigh - localized area. Discussed f/u with Dr Mavis. Continues on zoloft  and buspar . Covid 10/2023. Overdue f/u for thyroid  nodule. Back at work. Some lower extremity swelling. On her feet a lot. Has noticed over the last couple of weeks - increased blood pressure. No chest pain reported. Breathing overall stable. No abdominal pain or bowel change reported.    Past Medical History:  Diagnosis Date   Back pain 10/05/2023   Chicken pox    COVID 11/08/2023   Hypercholesteremia 11/09/2023   Hypertension    Menorrhagia    Thyroid  nodule 01/2022   benign   Past Surgical History:  Procedure Laterality Date   COLONOSCOPY WITH PROPOFOL  N/A 10/24/2021   Procedure: COLONOSCOPY WITH PROPOFOL ;  Surgeon: Therisa Bi, MD;  Location: [facility] ENDOSCOPY;  Service: Gastroenterology;  Laterality: N/A;   CYSTOSCOPY N/A 01/30/2020   Procedure: CYSTOSCOPY;  Surgeon: Arloa Lamar SQUIBB, MD;  Location: [facility] ORS;  Service: Gynecology;  Laterality: N/A;   TOTAL LAPAROSCOPIC HYSTERECTOMY WITH SALPINGECTOMY Bilateral 01/30/2020   Procedure: TOTAL LAPAROSCOPIC HYSTERECTOMY WITH RIGHT SALPINGECTOMY;  Surgeon: Arloa Lamar SQUIBB, MD;  Location: [facility] ORS;  Service: Gynecology;  Laterality: Bilateral;   TUBAL LIGATION     Tube reversal     Family History  Problem Relation Age of Onset   Stroke Mother    Hypertension Mother    Arthritis Mother    Hypertension Sister    Asthma Daughter    Breast cancer Paternal Grandmother    Ovarian cancer Neg Hx    Social History   Socioeconomic History   Marital status: Married    Spouse name: Not on file   Number of children: Not  on file   Years of education: Not on file   Highest education level: Not on file  Occupational History   Occupation: Preschool Teacher  Tobacco Use   Smoking status: Never   Smokeless tobacco: Never  Vaping Use   Vaping status: Never Used  Substance and Sexual Activity   Alcohol use: No   Drug use: No   Sexual activity: Not Currently    Birth control/protection: Surgical    Comment: Tubal ligation/reversed  Other Topics Concern   Not on file  Social History Narrative   Not on file   Social Drivers of Health   Financial Resource Strain: Not on file  Food Insecurity: Not on file  Transportation Needs: Not on file  Physical Activity: Not on file  Stress: Not on file  Social Connections: Not on file     Review of Systems  Constitutional:  Negative for appetite change and unexpected weight change.  HENT:  Negative for congestion and sinus pressure.   Respiratory:  Negative for cough, chest tightness and shortness of breath.   Cardiovascular:  Negative for chest pain and palpitations.       Increased swelling.   Gastrointestinal:  Negative for abdominal pain, diarrhea, nausea and vomiting.  Genitourinary:  Negative for difficulty urinating and dysuria.  Musculoskeletal:  Negative for joint swelling and myalgias.  Skin:  Negative for color change and rash.  Neurological:  Negative for dizziness  and headaches.  Psychiatric/Behavioral:  Negative for agitation and dysphoric mood.        Objective:     BP 122/88   Pulse 100   Resp 16   Ht 5' 9 (1.753 m)   Wt 276 lb (125.2 kg)   LMP 01/02/2020 (Exact Date)   SpO2 98%   BMI 40.76 kg/m  Wt Readings from Last 3 Encounters:  02/14/24 276 lb (125.2 kg)  12/02/23 273 lb (123.8 kg)  12/01/23 273 lb (123.8 kg)    Physical Exam Vitals reviewed.  Constitutional:      General: She is not in acute distress.    Appearance: Normal appearance.  HENT:     Head: Normocephalic and atraumatic.     Right Ear: External ear  normal.     Left Ear: External ear normal.     Mouth/Throat:     Pharynx: No oropharyngeal exudate or posterior oropharyngeal erythema.  Eyes:     General: No scleral icterus.       Right eye: No discharge.        Left eye: No discharge.     Conjunctiva/sclera: Conjunctivae normal.  Neck:     Thyroid : No thyromegaly.  Cardiovascular:     Rate and Rhythm: Normal rate and regular rhythm.  Pulmonary:     Effort: No respiratory distress.     Breath sounds: Normal breath sounds. No wheezing.  Abdominal:     General: Bowel sounds are normal.     Palpations: Abdomen is soft.     Tenderness: There is no abdominal tenderness.  Musculoskeletal:        General: No tenderness.     Cervical back: Neck supple. No tenderness.     Comments: Swelling - no increased erythema.   Lymphadenopathy:     Cervical: No cervical adenopathy.  Skin:    Findings: No erythema or rash.  Neurological:     Mental Status: She is alert.  Psychiatric:        Mood and Affect: Mood normal.        Behavior: Behavior normal.         Outpatient Encounter Medications as of 02/14/2024  Medication Sig   hydrochlorothiazide  (MICROZIDE ) 12.5 MG capsule Take 1 capsule (12.5 mg total) by mouth daily.   LYRICA 25 MG capsule Take 25 mg by mouth 2 (two) times daily.   amLODipine  (NORVASC ) 5 MG tablet TAKE 1 TABLET (5 MG TOTAL) BY MOUTH DAILY.   busPIRone  (BUSPAR ) 5 MG tablet TAKE 1 TABLET BY MOUTH TWICE A DAY   cyclobenzaprine  (FLEXERIL ) 10 MG tablet Take 1 tablet (10 mg total) by mouth 3 (three) times daily as needed for muscle spasms.   oxyCODONE -acetaminophen  (PERCOCET) 5-325 MG tablet Take 1-2 tablets by mouth every 4 (four) hours as needed.   sertraline  (ZOLOFT ) 100 MG tablet TAKE 1 TABLET BY MOUTH EVERY DAY   valsartan -hydrochlorothiazide  (DIOVAN -HCT) 160-12.5 MG tablet TAKE 1 TABLET BY MOUTH EVERY DAY   [DISCONTINUED] docusate sodium  (COLACE) 100 MG capsule Take 1 capsule (100 mg total) by mouth 2 (two) times  daily.   [DISCONTINUED] methylPREDNISolone  (MEDROL  DOSEPAK) 4 MG TBPK tablet Follow instructions on packet insert.   No facility-administered encounter medications on file as of 02/14/2024.     Lab Results  Component Value Date   WBC 8.0 02/14/2024   HGB 14.9 02/14/2024   HCT 44.1 02/14/2024   PLT 265.0 02/14/2024   GLUCOSE 97 02/14/2024   CHOL 250 (H) 02/14/2024   TRIG  321.0 (H) 02/14/2024   HDL 39.70 02/14/2024   LDLCALC 146 (H) 02/14/2024   ALT 47 (H) 02/14/2024   AST 28 02/14/2024   NA 138 02/14/2024   K 3.8 02/14/2024   CL 103 02/14/2024   CREATININE 1.08 02/14/2024   BUN 30 (H) 02/14/2024   CO2 23 02/14/2024   TSH 0.73 08/03/2023   HGBA1C 5.6 12/12/2019    DG Lumbar Spine 2-3 Views Result Date: 12/02/2023 CLINICAL DATA:  Elective surgery. EXAM: LUMBAR SPINE - 2-3 VIEW COMPARISON:  None Available. FINDINGS: Two fluoroscopic spot views of the lumbar spine submitted from the operating room. Pedicle screws at L5 and S1. Fluoroscopy time 62.6 seconds. Dose 60 mGy IMPRESSION: Intraoperative fluoroscopy during lumbar spine surgery. Electronically Signed   By: Andrea Gasman M.D.   On: 12/02/2023 12:53   DG C-Arm 1-60 Min-No Report Result Date: 12/02/2023 Fluoroscopy was utilized by the requesting physician.  No radiographic interpretation.   DG C-Arm 1-60 Min-No Report Result Date: 12/02/2023 Fluoroscopy was utilized by the requesting physician.  No radiographic interpretation.   DG Lumbar Spine 1 View Result Date: 12/02/2023 CLINICAL DATA:  Elective surgery.  Intraop localization. EXAM: LUMBAR SPINE - 1 VIEW COMPARISON:  Radiograph 10/05/2023 FINDINGS: Single lateral spot view of the lumbar spine submitted from the operating room. Surgical instruments localize posteriorly at the upper aspect of L5 level. IMPRESSION: Surgical instruments localize posteriorly at the upper aspect of L5 level. Electronically Signed   By: Andrea Gasman M.D.   On: 12/02/2023 11:20        Assessment & Plan:  History of MMR vaccination -     Measles/Mumps/Rubella Immunity  Hypertension, essential Assessment & Plan: Continue valsartan /hctz 160/12.5 and amlodipine  5mg .  Blood pressure elevated recently. Discussed leg swelling and treatment options. Will add hydrochlorothiazide  12.5mg  q day. Continue to spot check pressure.  Follow metabolic panel. Get her back in soon to reassess. Send in readings.   Orders: -     CBC with Differential/Platelet -     Basic metabolic panel with GFR  Hypercholesteremia Assessment & Plan: Low cholesterol diet and exercise. Follow lipid panel.   Orders: -     Lipid panel -     Hepatic function panel  Low back pain, unspecified back pain laterality, unspecified chronicity, unspecified whether sciatica present Assessment & Plan:  S/p posterior lumbar fusion 12/02/23 - Dr Mavis. Back is better. Does report noticing some numbness right thigh - localized area. Discussed f/u with Dr Mavis.   Stress Assessment & Plan: Continue zoloft . Follow. No changes today.    Thyroid  nodule Assessment & Plan:  S/p thyroid  biopsy - benign (evaluated 01/2022).  Recommended f/u thyroid  ultrasound and appt in one year.  Need to confirm f/u.    Other orders -     hydroCHLOROthiazide ; Take 1 capsule (12.5 mg total) by mouth daily.  Dispense: 30 capsule; Refill: 1     Allena Hamilton, MD

## 2024-02-15 LAB — BASIC METABOLIC PANEL WITH GFR
BUN: 30 mg/dL — ABNORMAL HIGH (ref 6–23)
CO2: 23 meq/L (ref 19–32)
Calcium: 9.6 mg/dL (ref 8.4–10.5)
Chloride: 103 meq/L (ref 96–112)
Creatinine, Ser: 1.08 mg/dL (ref 0.40–1.20)
GFR: 59.8 mL/min — ABNORMAL LOW (ref >60.00)
Glucose, Bld: 97 mg/dL (ref 70–99)
Potassium: 3.8 meq/L (ref 3.5–5.1)
Sodium: 138 meq/L (ref 135–145)

## 2024-02-15 LAB — LIPID PANEL
Cholesterol: 250 mg/dL — ABNORMAL HIGH (ref 0–200)
HDL: 39.7 mg/dL (ref >39.00)
LDL Cholesterol: 146 mg/dL — ABNORMAL HIGH (ref 0–99)
NonHDL: 210
Total CHOL/HDL Ratio: 6
Triglycerides: 321 mg/dL — ABNORMAL HIGH (ref 0.0–149.0)
VLDL: 64.2 mg/dL — ABNORMAL HIGH (ref 0.0–40.0)

## 2024-02-15 LAB — MEASLES/MUMPS/RUBELLA IMMUNITY
Mumps IgG: <9 [AU]/ml — ABNORMAL LOW
Rubella: <0.9 {index} — ABNORMAL LOW
Rubeola IgG: 43 [AU]/ml

## 2024-02-15 LAB — HEPATIC FUNCTION PANEL
ALT: 47 U/L — ABNORMAL HIGH (ref 0–35)
AST: 28 U/L (ref 0–37)
Albumin: 4.5 g/dL (ref 3.5–5.2)
Alkaline Phosphatase: 83 U/L (ref 39–117)
Bilirubin, Direct: 0.1 mg/dL (ref 0.0–0.3)
Total Bilirubin: 0.4 mg/dL (ref 0.2–1.2)
Total Protein: 6.7 g/dL (ref 6.0–8.3)

## 2024-02-15 LAB — CBC WITH DIFFERENTIAL/PLATELET
Basophils Absolute: 0.1 K/uL (ref 0.0–0.1)
Basophils Relative: 1.1 % (ref 0.0–3.0)
Eosinophils Absolute: 0.3 K/uL (ref 0.0–0.7)
Eosinophils Relative: 3.9 % (ref 0.0–5.0)
HCT: 44.1 % (ref 36.0–46.0)
Hemoglobin: 14.9 g/dL (ref 12.0–15.0)
Lymphocytes Relative: 22 % (ref 12.0–46.0)
Lymphs Abs: 1.8 K/uL (ref 0.7–4.0)
MCHC: 33.7 g/dL (ref 30.0–36.0)
MCV: 91.5 fl (ref 78.0–100.0)
Monocytes Absolute: 0.8 K/uL (ref 0.1–1.0)
Monocytes Relative: 10.6 % (ref 3.0–12.0)
Neutro Abs: 5 K/uL (ref 1.4–7.7)
Neutrophils Relative %: 62.4 % (ref 43.0–77.0)
Platelets: 265 K/uL (ref 150.0–400.0)
RBC: 4.82 Mil/uL (ref 3.87–5.11)
RDW: 13.7 % (ref 11.5–15.5)
WBC: 8 K/uL (ref 4.0–10.5)

## 2024-02-18 ENCOUNTER — Ambulatory Visit: Payer: Self-pay | Admitting: Internal Medicine

## 2024-02-19 ENCOUNTER — Encounter: Payer: Self-pay | Admitting: Internal Medicine

## 2024-02-19 NOTE — Assessment & Plan Note (Signed)
 S/p posterior lumbar fusion 12/02/23 - Dr Mavis. Back is better. Does report noticing some numbness right thigh - localized area. Discussed f/u with Dr Mavis.

## 2024-02-19 NOTE — Assessment & Plan Note (Signed)
 Continue valsartan /hctz 160/12.5 and amlodipine  5mg .  Blood pressure elevated recently. Discussed leg swelling and treatment options. Will add hydrochlorothiazide  12.5mg  q day. Continue to spot check pressure.  Follow metabolic panel. Get her back in soon to reassess. Send in readings.

## 2024-02-19 NOTE — Assessment & Plan Note (Signed)
 Low cholesterol diet and exercise.  Follow lipid panel.

## 2024-02-19 NOTE — Assessment & Plan Note (Signed)
 S/p thyroid  biopsy - benign (evaluated 01/2022).  Recommended f/u thyroid  ultrasound and appt in one year.  Need to confirm f/u.

## 2024-02-19 NOTE — Assessment & Plan Note (Signed)
 Continue zoloft . Follow. No changes today.

## 2024-02-21 ENCOUNTER — Other Ambulatory Visit: Payer: Self-pay

## 2024-02-21 DIAGNOSIS — I10 Essential (primary) hypertension: Secondary | ICD-10-CM

## 2024-02-21 DIAGNOSIS — E7800 Pure hypercholesterolemia, unspecified: Secondary | ICD-10-CM

## 2024-02-21 NOTE — Progress Notes (Signed)
 [facility] Lipid Diet was mailed to patient.

## 2024-02-21 NOTE — Telephone Encounter (Signed)
 Entered in error

## 2024-03-06 ENCOUNTER — Other Ambulatory Visit (INDEPENDENT_AMBULATORY_CARE_PROVIDER_SITE_OTHER)

## 2024-03-06 DIAGNOSIS — E7800 Pure hypercholesterolemia, unspecified: Secondary | ICD-10-CM

## 2024-03-06 DIAGNOSIS — I10 Essential (primary) hypertension: Secondary | ICD-10-CM

## 2024-03-06 LAB — BASIC METABOLIC PANEL WITH GFR
BUN: 24 mg/dL — ABNORMAL HIGH (ref 6–23)
CO2: 27 meq/L (ref 19–32)
Calcium: 9.2 mg/dL (ref 8.4–10.5)
Chloride: 102 meq/L (ref 96–112)
Creatinine, Ser: 1.07 mg/dL (ref 0.40–1.20)
GFR: 60.44 mL/min (ref >60.00)
Glucose, Bld: 100 mg/dL — ABNORMAL HIGH (ref 70–99)
Potassium: 3.6 meq/L (ref 3.5–5.1)
Sodium: 139 meq/L (ref 135–145)

## 2024-03-06 LAB — HEPATIC FUNCTION PANEL
ALT: 49 U/L — ABNORMAL HIGH (ref 0–35)
AST: 31 U/L (ref 0–37)
Albumin: 4.3 g/dL (ref 3.5–5.2)
Alkaline Phosphatase: 91 U/L (ref 39–117)
Bilirubin, Direct: 0.1 mg/dL (ref 0.0–0.3)
Total Bilirubin: 0.4 mg/dL (ref 0.2–1.2)
Total Protein: 6.5 g/dL (ref 6.0–8.3)

## 2024-03-07 ENCOUNTER — Encounter: Payer: Self-pay | Admitting: Internal Medicine

## 2024-03-07 ENCOUNTER — Ambulatory Visit: Payer: Self-pay | Admitting: Internal Medicine

## 2024-03-07 ENCOUNTER — Other Ambulatory Visit: Payer: Self-pay | Admitting: Internal Medicine

## 2024-03-07 NOTE — Telephone Encounter (Signed)
 Copied from CRM #[id number]. Topic: Clinical - Medication Question >> Mar 07, 2024  9:52 AM [place] J wrote: Reason for CRM: Patient is returning a call from [place] (CMA) in regards to a medication question she had. Patient is needing more clarification on her questions. Please call back.

## 2024-03-07 NOTE — Telephone Encounter (Signed)
 Copied from CRM #[id number]. Topic: Clinical - Lab/Test Results >> Mar 07, 2024  9:52 AM [place] J wrote: Reason for CRM: Patient received lab results and has no further questions at this time.

## 2024-03-07 NOTE — Telephone Encounter (Signed)
 Confirmed with patient that she is taking both medications. Changed hydrochlorothiazide  rx to 90 day.

## 2024-03-07 NOTE — Telephone Encounter (Signed)
 Called and LVM for Pt wanted to verify which medication she is taking. Waiting for a call back

## 2024-03-08 NOTE — Telephone Encounter (Signed)
 Can clarify how much she is taking. It does not sound like she is taking a large amount of pain medications. Would like to do the abdominal ultrasound to check liver if still agreeable.

## 2024-03-08 NOTE — Telephone Encounter (Signed)
 Reviewed pt chart per review pt was made aware of results

## 2024-03-10 NOTE — Telephone Encounter (Signed)
 Reviewed. Would still like to check an abdominal ultrasound if she is agreeable. Please confirm is agreeable. I assume she is continuing to f/u regarding her back. Hopefully can get under control.

## 2024-03-13 ENCOUNTER — Other Ambulatory Visit: Payer: Self-pay | Admitting: Internal Medicine

## 2024-03-13 DIAGNOSIS — R7989 Other specified abnormal findings of blood chemistry: Secondary | ICD-10-CM

## 2024-03-13 NOTE — Progress Notes (Signed)
Order placed for abdominal ultrasound.   

## 2024-03-16 ENCOUNTER — Encounter: Payer: Self-pay | Admitting: Internal Medicine

## 2024-03-16 ENCOUNTER — Ambulatory Visit: Admitting: Internal Medicine

## 2024-03-16 VITALS — BP 124/80 | HR 90 | Resp 16 | Ht 69.0 in | Wt 274.8 lb

## 2024-03-16 DIAGNOSIS — Z1231 Encounter for screening mammogram for malignant neoplasm of breast: Secondary | ICD-10-CM | POA: Diagnosis not present

## 2024-03-16 DIAGNOSIS — E7800 Pure hypercholesterolemia, unspecified: Secondary | ICD-10-CM

## 2024-03-16 DIAGNOSIS — I10 Essential (primary) hypertension: Secondary | ICD-10-CM

## 2024-03-16 DIAGNOSIS — F439 Reaction to severe stress, unspecified: Secondary | ICD-10-CM

## 2024-03-16 DIAGNOSIS — R232 Flushing: Secondary | ICD-10-CM

## 2024-03-16 DIAGNOSIS — Z1211 Encounter for screening for malignant neoplasm of colon: Secondary | ICD-10-CM

## 2024-03-16 DIAGNOSIS — E041 Nontoxic single thyroid nodule: Secondary | ICD-10-CM

## 2024-03-16 DIAGNOSIS — R198 Other specified symptoms and signs involving the digestive system and abdomen: Secondary | ICD-10-CM

## 2024-03-16 MED ORDER — AMLODIPINE BESYLATE 5 MG PO TABS: 5.0000 mg | ORAL_TABLET | Freq: Every day | ORAL | 1 refills | Status: DC

## 2024-03-16 MED ORDER — SERTRALINE HCL 100 MG PO TABS: 100.0000 mg | ORAL_TABLET | Freq: Every day | ORAL | 2 refills | Status: DC

## 2024-03-16 MED ORDER — BUSPIRONE HCL 5 MG PO TABS: 5.0000 mg | ORAL_TABLET | Freq: Two times a day (BID) | ORAL | 1 refills | Status: DC

## 2024-03-16 MED ORDER — VALSARTAN-HYDROCHLOROTHIAZIDE 320-12.5 MG PO TABS: 1.0000 | ORAL_TABLET | Freq: Every day | ORAL | 1 refills | Status: DC

## 2024-03-16 NOTE — Patient Instructions (Signed)
 Stop hydrochlorothiazide  12.5mg  and stop valsartan /hydrochlorothiazide  160/2.5.   Start valsartan /hydrochlorothiazide  320/12.5 - one per day.

## 2024-03-16 NOTE — Progress Notes (Signed)
 Subjective:    Patient ID: Cindy Martin, female    DOB: 1971/01/20, 53 y.o.   MRN: [id number]  Patient here for  Chief Complaint  Patient presents with   Medical Management of Chronic Issues    HPI Here for a scheduled follow up - follow up regarding blood pressure. Is s/p posterior lumbar fusion 12/02/23 - Dr Mavis. Back doing better. Continues on zoloft  and buspar . Last visit, blood pressure elevated. Hydrochlorothiazide  12.5mg  added to valsartan /hydrochlorothiazide . Since being on the 12.5mg  hydrochlorothiazide , she has noticed some bowel change. Started 4-5 days after starting the medication and has continued. No diarrhea. Formed stool, but having multiple stools per day. Yesterday - 8 stools. Most days only 2. Intermittently will notice BRB on tissue with wiping. Relates this to her internal hemorrhoid. Only notices with increased stool - intermittent. Some cramping with having to have a bowel movement, but resolves after she goes to the bathroom. No abdominal pain. Some pedal and lower extremity swelling. Some numbness  - right lateral thigh - mid thigh to knee. Has f/u with surgery in 04/2024. Blood pressure remaining elevated with additional hydrochlorothiazide  - 130s/90.    Past Medical History:  Diagnosis Date   Back pain 10/05/2023   Chicken pox    COVID 11/08/2023   Hypercholesteremia 11/09/2023   Hypertension    Menorrhagia    Thyroid  nodule 01/2022   benign   Past Surgical History:  Procedure Laterality Date   COLONOSCOPY WITH PROPOFOL  N/A 10/24/2021   Procedure: COLONOSCOPY WITH PROPOFOL ;  Surgeon: Therisa Bi, MD;  Location: [facility] ENDOSCOPY;  Service: Gastroenterology;  Laterality: N/A;   CYSTOSCOPY N/A 01/30/2020   Procedure: CYSTOSCOPY;  Surgeon: Arloa Lamar SQUIBB, MD;  Location: [facility] ORS;  Service: Gynecology;  Laterality: N/A;   TOTAL LAPAROSCOPIC HYSTERECTOMY WITH SALPINGECTOMY Bilateral 01/30/2020   Procedure: TOTAL LAPAROSCOPIC HYSTERECTOMY WITH RIGHT  SALPINGECTOMY;  Surgeon: Arloa Lamar SQUIBB, MD;  Location: [facility] ORS;  Service: Gynecology;  Laterality: Bilateral;   TUBAL LIGATION     Tube reversal     Family History  Problem Relation Age of Onset   Stroke Mother    Hypertension Mother    Arthritis Mother    Hypertension Sister    Asthma Daughter    Breast cancer Paternal Grandmother    Ovarian cancer Neg Hx    Social History   Socioeconomic History   Marital status: Married    Spouse name: Not on file   Number of children: Not on file   Years of education: Not on file   Highest education level: Not on file  Occupational History   Occupation: Preschool Teacher  Tobacco Use   Smoking status: Never   Smokeless tobacco: Never  Vaping Use   Vaping status: Never Used  Substance and Sexual Activity   Alcohol use: No   Drug use: No   Sexual activity: Not Currently    Birth control/protection: Surgical    Comment: Tubal ligation/reversed  Other Topics Concern   Not on file  Social History Narrative   Not on file   Social Drivers of Health   Financial Resource Strain: Not on file  Food Insecurity: Not on file  Transportation Needs: Not on file  Physical Activity: Not on file  Stress: Not on file  Social Connections: Not on file     Review of Systems  Constitutional:  Negative for appetite change and unexpected weight change.  HENT:  Negative for congestion and sinus pressure.   Respiratory:  Negative for  cough, chest tightness and shortness of breath.   Cardiovascular:  Negative for chest pain and palpitations.       Reports has noticed some increased swelling.   Gastrointestinal:  Negative for vomiting.       Abdominal discomfort resolves after having bm. No abdominal pain now. Frequent stools as outlined.   Genitourinary:  Negative for difficulty urinating and dysuria.  Musculoskeletal:  Negative for joint swelling and myalgias.  Skin:  Negative for color change and rash.  Neurological:  Negative for dizziness  and headaches.  Psychiatric/Behavioral:  Negative for agitation and dysphoric mood.        Objective:     BP 124/80   Pulse 90   Resp 16   Ht 5' 9 (1.753 m)   Wt 274 lb 12.8 oz (124.6 kg)   LMP 01/02/2020 (Exact Date)   SpO2 98%   BMI 40.58 kg/m  Wt Readings from Last 3 Encounters:  03/16/24 274 lb 12.8 oz (124.6 kg)  02/14/24 276 lb (125.2 kg)  12/02/23 273 lb (123.8 kg)    Physical Exam Vitals reviewed.  Constitutional:      General: She is not in acute distress.    Appearance: Normal appearance.  HENT:     Head: Normocephalic and atraumatic.     Right Ear: External ear normal.     Left Ear: External ear normal.     Mouth/Throat:     Pharynx: No oropharyngeal exudate or posterior oropharyngeal erythema.  Eyes:     General: No scleral icterus.       Right eye: No discharge.        Left eye: No discharge.     Conjunctiva/sclera: Conjunctivae normal.  Neck:     Thyroid : No thyromegaly.  Cardiovascular:     Rate and Rhythm: Normal rate and regular rhythm.  Pulmonary:     Effort: No respiratory distress.     Breath sounds: Normal breath sounds. No wheezing.  Abdominal:     General: Bowel sounds are normal.     Palpations: Abdomen is soft.     Tenderness: There is no abdominal tenderness.  Genitourinary:    Comments: Declined rectal exam.  Musculoskeletal:        General: No swelling or tenderness.     Cervical back: Neck supple. No tenderness.  Lymphadenopathy:     Cervical: No cervical adenopathy.  Skin:    Findings: No erythema or rash.  Neurological:     Mental Status: She is alert.  Psychiatric:        Mood and Affect: Mood normal.        Behavior: Behavior normal.         Outpatient Encounter Medications as of 03/16/2024  Medication Sig   valsartan -hydrochlorothiazide  (DIOVAN -HCT) 320-12.5 MG tablet Take 1 tablet by mouth daily.   amLODipine  (NORVASC ) 5 MG tablet Take 1 tablet (5 mg total) by mouth daily.   busPIRone  (BUSPAR ) 5 MG tablet  Take 1 tablet (5 mg total) by mouth 2 (two) times daily.   cyclobenzaprine  (FLEXERIL ) 10 MG tablet Take 1 tablet (10 mg total) by mouth 3 (three) times daily as needed for muscle spasms.   LYRICA 25 MG capsule Take 25 mg by mouth 2 (two) times daily.   oxyCODONE -acetaminophen  (PERCOCET) 5-325 MG tablet Take 1-2 tablets by mouth every 4 (four) hours as needed.   sertraline  (ZOLOFT ) 100 MG tablet Take 1 tablet (100 mg total) by mouth daily.   [DISCONTINUED] amLODipine  (NORVASC ) 5 MG tablet  TAKE 1 TABLET (5 MG TOTAL) BY MOUTH DAILY.   [DISCONTINUED] busPIRone  (BUSPAR ) 5 MG tablet TAKE 1 TABLET BY MOUTH TWICE A DAY   [DISCONTINUED] hydrochlorothiazide  (MICROZIDE ) 12.5 MG capsule TAKE 1 CAPSULE BY MOUTH EVERY DAY   [DISCONTINUED] sertraline  (ZOLOFT ) 100 MG tablet TAKE 1 TABLET BY MOUTH EVERY DAY   [DISCONTINUED] valsartan -hydrochlorothiazide  (DIOVAN -HCT) 160-12.5 MG tablet TAKE 1 TABLET BY MOUTH EVERY DAY   No facility-administered encounter medications on file as of 03/16/2024.     Lab Results  Component Value Date   WBC 8.0 02/14/2024   HGB 14.9 02/14/2024   HCT 44.1 02/14/2024   PLT 265.0 02/14/2024   GLUCOSE 100 (H) 03/06/2024   CHOL 250 (H) 02/14/2024   TRIG 321.0 (H) 02/14/2024   HDL 39.70 02/14/2024   LDLCALC 146 (H) 02/14/2024   ALT 49 (H) 03/06/2024   AST 31 03/06/2024   NA 139 03/06/2024   K 3.6 03/06/2024   CL 102 03/06/2024   CREATININE 1.07 03/06/2024   BUN 24 (H) 03/06/2024   CO2 27 03/06/2024   TSH 0.73 08/03/2023   HGBA1C 5.6 12/12/2019    DG Lumbar Spine 2-3 Views Result Date: 12/02/2023 CLINICAL DATA:  Elective surgery. EXAM: LUMBAR SPINE - 2-3 VIEW COMPARISON:  None Available. FINDINGS: Two fluoroscopic spot views of the lumbar spine submitted from the operating room. Pedicle screws at L5 and S1. Fluoroscopy time 62.6 seconds. Dose 60 mGy IMPRESSION: Intraoperative fluoroscopy during lumbar spine surgery. Electronically Signed   By: Andrea Gasman M.D.   On:  12/02/2023 12:53   DG C-Arm 1-60 Min-No Report Result Date: 12/02/2023 Fluoroscopy was utilized by the requesting physician.  No radiographic interpretation.   DG C-Arm 1-60 Min-No Report Result Date: 12/02/2023 Fluoroscopy was utilized by the requesting physician.  No radiographic interpretation.   DG Lumbar Spine 1 View Result Date: 12/02/2023 CLINICAL DATA:  Elective surgery.  Intraop localization. EXAM: LUMBAR SPINE - 1 VIEW COMPARISON:  Radiograph 10/05/2023 FINDINGS: Single lateral spot view of the lumbar spine submitted from the operating room. Surgical instruments localize posteriorly at the upper aspect of L5 level. IMPRESSION: Surgical instruments localize posteriorly at the upper aspect of L5 level. Electronically Signed   By: Andrea Gasman M.D.   On: 12/02/2023 11:20       Assessment & Plan:  Visit for screening mammogram -     3D Screening Mammogram, Left and Right; Future  Hypercholesteremia Assessment & Plan: Low cholesterol diet and exercise. Follow lipid panel.   Orders: -     Hepatic function panel; Future -     Lipid panel; Future  Hypertension, essential Assessment & Plan: Did not tolerate additional 12.5mg  hydrochlorothiazide . Will change diovan /hydrochlorothiazide  from 160/12.5 to 320/12.5. follow pressures. Follow metabolic panel. Get her back in soon to reassess.   Orders: -     Basic metabolic panel with GFR; Future  Hot flashes -     TSH; Future  Colon cancer screening Assessment & Plan: Colonoscopy 10/2021 - recommended f/u in 3 years.    Stress Assessment & Plan: Increased stress. Continue zoloft . No changes today. Follow.    Thyroid  nodule Assessment & Plan:  S/p thyroid  biopsy - benign (evaluated 01/2022).  Recommended f/u thyroid  ultrasound and appt in one year.  Need to confirm f/u. Has f/u ultrasound liver tomorrow.    Change in bowel function Assessment & Plan: Reports noticing after starting the additional hydrochlorothiazide ,  more frequent stools, some cramps and yesterday - blood with wiping. Discussed further w/up  and evaluation. Benefiber for bowels. Will stop the additional hydrochlorothiazide . Not sure if contributing. Follow. Call with update. Colonoscopy as outlined (2023).    Other orders -     amLODIPine  Besylate; Take 1 tablet (5 mg total) by mouth daily.  Dispense: 90 tablet; Refill: 1 -     busPIRone  HCl; Take 1 tablet (5 mg total) by mouth 2 (two) times daily.  Dispense: 180 tablet; Refill: 1 -     Sertraline  HCl; Take 1 tablet (100 mg total) by mouth daily.  Dispense: 90 tablet; Refill: 2 -     Valsartan -hydroCHLOROthiazide ; Take 1 tablet by mouth daily.  Dispense: 90 tablet; Refill: 1     Allena Hamilton, MD

## 2024-03-17 ENCOUNTER — Ambulatory Visit
Admission: RE | Admit: 2024-03-17 | Discharge: 2024-03-17 | Disposition: A | Discharge status: Home / Self Care | Source: Ambulatory Visit | Attending: Internal Medicine | Admitting: Internal Medicine

## 2024-03-17 DIAGNOSIS — R7989 Other specified abnormal findings of blood chemistry: Secondary | ICD-10-CM | POA: Insufficient documentation

## 2024-03-21 ENCOUNTER — Ambulatory Visit: Payer: Self-pay | Admitting: Internal Medicine

## 2024-03-21 ENCOUNTER — Encounter: Payer: Self-pay | Admitting: Internal Medicine

## 2024-03-21 DIAGNOSIS — N281 Cyst of kidney, acquired: Secondary | ICD-10-CM | POA: Insufficient documentation

## 2024-03-26 ENCOUNTER — Encounter: Payer: Self-pay | Admitting: Internal Medicine

## 2024-03-26 DIAGNOSIS — R198 Other specified symptoms and signs involving the digestive system and abdomen: Secondary | ICD-10-CM | POA: Insufficient documentation

## 2024-03-26 NOTE — Assessment & Plan Note (Signed)
Colonoscopy 10/2021 - recommended f/u in 3 years.

## 2024-03-26 NOTE — Assessment & Plan Note (Signed)
 Did not tolerate additional 12.5mg  hydrochlorothiazide . Will change diovan /hydrochlorothiazide  from 160/12.5 to 320/12.5. follow pressures. Follow metabolic panel. Get her back in soon to reassess.

## 2024-03-26 NOTE — Assessment & Plan Note (Signed)
 Reports noticing after starting the additional hydrochlorothiazide , more frequent stools, some cramps and yesterday - blood with wiping. Discussed further w/up and evaluation. Benefiber for bowels. Will stop the additional hydrochlorothiazide . Not sure if contributing. Follow. Call with update. Colonoscopy as outlined (2023).

## 2024-03-26 NOTE — Assessment & Plan Note (Signed)
 Increased stress. Continue zoloft . No changes today. Follow.

## 2024-03-26 NOTE — Assessment & Plan Note (Signed)
 Low cholesterol diet and exercise.  Follow lipid panel.

## 2024-03-26 NOTE — Assessment & Plan Note (Signed)
 S/p thyroid  biopsy - benign (evaluated 01/2022).  Recommended f/u thyroid  ultrasound and appt in one year.  Need to confirm f/u. Has f/u ultrasound liver tomorrow.

## 2024-04-03 ENCOUNTER — Encounter: Payer: Self-pay | Admitting: Internal Medicine

## 2024-04-03 NOTE — Telephone Encounter (Signed)
 Given persistent variation and given elevated diastolic, please schedule an appt to recheck and see about adjusting/adding medication.

## 2024-04-03 NOTE — Telephone Encounter (Signed)
 Appointment scheduled.

## 2024-04-04 ENCOUNTER — Ambulatory Visit: Admitting: Internal Medicine

## 2024-04-04 ENCOUNTER — Encounter: Payer: Self-pay | Admitting: Internal Medicine

## 2024-04-04 VITALS — BP 136/84 | HR 90 | Resp 16 | Ht 69.0 in | Wt 276.0 lb

## 2024-04-04 DIAGNOSIS — E041 Nontoxic single thyroid nodule: Secondary | ICD-10-CM

## 2024-04-04 DIAGNOSIS — I10 Essential (primary) hypertension: Secondary | ICD-10-CM

## 2024-04-04 DIAGNOSIS — E7800 Pure hypercholesterolemia, unspecified: Secondary | ICD-10-CM

## 2024-04-04 MED ORDER — CARVEDILOL 3.125 MG PO TABS: 3.1250 mg | ORAL_TABLET | Freq: Two times a day (BID) | ORAL | 2 refills | Status: DC

## 2024-04-04 NOTE — Assessment & Plan Note (Signed)
 Did not tolerate additional 12.5mg  hydrochlorothiazide . Last visit, diovan /hydrochlorothiazide  changed from 160/12.5 to 320/12.5. blood pressure remaining elevated above goal. Pulse rate 90-100. Discussed treatment options. Will start low dose carvedilol  3.125mg  bid. Follow pressures. Send in readings. Can titrate dose if needed.

## 2024-04-04 NOTE — Assessment & Plan Note (Signed)
 S/p thyroid  biopsy - benign (evaluated 01/2022).  Recommended f/u thyroid  ultrasound and appt in one year.  Overdue f/u. Has wanted to hold scheduling. Agreeable now.

## 2024-04-04 NOTE — Assessment & Plan Note (Signed)
 Low cholesterol diet and exercise.  Follow lipid panel.

## 2024-04-04 NOTE — Progress Notes (Signed)
 Subjective:    Patient ID: Cindy Martin, female    DOB: 1971-04-17, 53 y.o.   MRN: [id number]  Patient here for  Chief Complaint  Patient presents with   Hypertension    Wants to discuss atenolol and triam-hctz    HPI Here for work in appt. Work in to discuss her blood pressure. Varying. Elevated diastolics. She did not tolerate an increased dose of hydrochlorothiazide . Last visit, diovan /hydrochlorothiazide  changed to 320/12.5. still with persistent elevation. Yesterday - 163/83. Most averaging 120-130s/80s-90s. No chest pain or sob reported. When blood pressure elevated, does have some headache. No abdominal pain or bowel change. Stress - ok. Discussed treatment options. Pulse rate staying in the 90-100 range.    Past Medical History:  Diagnosis Date   Back pain 10/05/2023   Chicken pox    COVID 11/08/2023   Hypercholesteremia 11/09/2023   Hypertension    Menorrhagia    Thyroid  nodule 01/2022   benign   Past Surgical History:  Procedure Laterality Date   COLONOSCOPY WITH PROPOFOL  N/A 10/24/2021   Procedure: COLONOSCOPY WITH PROPOFOL ;  Surgeon: Therisa Bi, MD;  Location: [facility] ENDOSCOPY;  Service: Gastroenterology;  Laterality: N/A;   CYSTOSCOPY N/A 01/30/2020   Procedure: CYSTOSCOPY;  Surgeon: Arloa Lamar SQUIBB, MD;  Location: [facility] ORS;  Service: Gynecology;  Laterality: N/A;   TOTAL LAPAROSCOPIC HYSTERECTOMY WITH SALPINGECTOMY Bilateral 01/30/2020   Procedure: TOTAL LAPAROSCOPIC HYSTERECTOMY WITH RIGHT SALPINGECTOMY;  Surgeon: Arloa Lamar SQUIBB, MD;  Location: [facility] ORS;  Service: Gynecology;  Laterality: Bilateral;   TUBAL LIGATION     Tube reversal     Family History  Problem Relation Age of Onset   Stroke Mother    Hypertension Mother    Arthritis Mother    Hypertension Sister    Asthma Daughter    Breast cancer Paternal Grandmother    Ovarian cancer Neg Hx    Social History   Socioeconomic History   Marital status: Married    Spouse name: Not on file    Number of children: Not on file   Years of education: Not on file   Highest education level: Not on file  Occupational History   Occupation: Preschool Teacher  Tobacco Use   Smoking status: Never   Smokeless tobacco: Never  Vaping Use   Vaping status: Never Used  Substance and Sexual Activity   Alcohol use: No   Drug use: No   Sexual activity: Not Currently    Birth control/protection: Surgical    Comment: Tubal ligation/reversed  Other Topics Concern   Not on file  Social History Narrative   Not on file   Social Drivers of Health   Financial Resource Strain: Not on file  Food Insecurity: Not on file  Transportation Needs: Not on file  Physical Activity: Not on file  Stress: Not on file  Social Connections: Not on file     Review of Systems  Constitutional:  Negative for appetite change and unexpected weight change.  HENT:  Negative for congestion and sinus pressure.   Respiratory:  Negative for cough, chest tightness and shortness of breath.   Cardiovascular:  Negative for chest pain and palpitations.       No increased swelling.   Gastrointestinal:  Negative for abdominal pain, diarrhea, nausea and vomiting.  Genitourinary:  Negative for difficulty urinating and dysuria.  Musculoskeletal:  Negative for joint swelling and myalgias.  Skin:  Negative for color change and rash.  Neurological:  Negative for dizziness and headaches.  Psychiatric/Behavioral:  Negative for agitation and dysphoric mood.        Objective:     BP 136/84   Pulse 90   Resp 16   Ht 5' 9 (1.753 m)   Wt 276 lb (125.2 kg)   LMP 01/02/2020 (Exact Date)   SpO2 98%   BMI 40.76 kg/m  Wt Readings from Last 3 Encounters:  04/04/24 276 lb (125.2 kg)  03/16/24 274 lb 12.8 oz (124.6 kg)  02/14/24 276 lb (125.2 kg)    Physical Exam Vitals reviewed.  Constitutional:      General: She is not in acute distress.    Appearance: Normal appearance.  HENT:     Head: Normocephalic and  atraumatic.     Right Ear: External ear normal.     Left Ear: External ear normal.     Mouth/Throat:     Pharynx: No oropharyngeal exudate or posterior oropharyngeal erythema.  Eyes:     General: No scleral icterus.       Right eye: No discharge.        Left eye: No discharge.     Conjunctiva/sclera: Conjunctivae normal.  Neck:     Thyroid : No thyromegaly.  Cardiovascular:     Rate and Rhythm: Normal rate and regular rhythm.  Pulmonary:     Effort: No respiratory distress.     Breath sounds: Normal breath sounds. No wheezing.  Abdominal:     General: Bowel sounds are normal.     Palpations: Abdomen is soft.     Tenderness: There is no abdominal tenderness.  Musculoskeletal:        General: No swelling or tenderness.     Cervical back: Neck supple. No tenderness.  Lymphadenopathy:     Cervical: No cervical adenopathy.  Skin:    Findings: No erythema or rash.  Neurological:     Mental Status: She is alert.  Psychiatric:        Mood and Affect: Mood normal.        Behavior: Behavior normal.         Outpatient Encounter Medications as of 04/04/2024  Medication Sig   carvedilol  (COREG ) 3.125 MG tablet Take 1 tablet (3.125 mg total) by mouth 2 (two) times daily with a meal.   amLODipine  (NORVASC ) 5 MG tablet Take 1 tablet (5 mg total) by mouth daily.   busPIRone  (BUSPAR ) 5 MG tablet Take 1 tablet (5 mg total) by mouth 2 (two) times daily.   cyclobenzaprine  (FLEXERIL ) 10 MG tablet Take 1 tablet (10 mg total) by mouth 3 (three) times daily as needed for muscle spasms.   LYRICA 25 MG capsule Take 25 mg by mouth 2 (two) times daily.   oxyCODONE -acetaminophen  (PERCOCET) 5-325 MG tablet Take 1-2 tablets by mouth every 4 (four) hours as needed.   sertraline  (ZOLOFT ) 100 MG tablet Take 1 tablet (100 mg total) by mouth daily.   valsartan -hydrochlorothiazide  (DIOVAN -HCT) 320-12.5 MG tablet Take 1 tablet by mouth daily.   No facility-administered encounter medications on file as of  04/04/2024.     Lab Results  Component Value Date   WBC 8.0 02/14/2024   HGB 14.9 02/14/2024   HCT 44.1 02/14/2024   PLT 265.0 02/14/2024   GLUCOSE 100 (H) 03/06/2024   CHOL 250 (H) 02/14/2024   TRIG 321.0 (H) 02/14/2024   HDL 39.70 02/14/2024   LDLCALC 146 (H) 02/14/2024   ALT 49 (H) 03/06/2024   AST 31 03/06/2024   NA 139 03/06/2024   K 3.6 03/06/2024  CL 102 03/06/2024   CREATININE 1.07 03/06/2024   BUN 24 (H) 03/06/2024   CO2 27 03/06/2024   TSH 0.73 08/03/2023   HGBA1C 5.6 12/12/2019    US  Abdomen Complete Result Date: 03/19/2024 CLINICAL DATA:  abnormal liver function tests. EXAM: ABDOMEN ULTRASOUND COMPLETE COMPARISON:  None Available. FINDINGS: Gallbladder: No gallstones or wall thickening visualized. No sonographic Murphy sign noted by sonographer. Common bile duct: Diameter: Visualized portion measures 2 mm, within normal limits. Liver: No focal lesion identified. Diffusely increased in parenchymal echogenicity with poor acoustic penetration. Portal vein is grossly patent on color Doppler imaging with normal direction of blood flow towards the liver. IVC: No abnormality visualized. Pancreas: Visualized portion unremarkable. Spleen: Size and appearance within normal limits. Right Kidney: Length: 11.8 cm. Echogenicity within normal limits. No hydronephrosis visualized. There is an anechoic mass of the kidney measuring 16 x 15 x 12 mm consistent with a benign cyst (for which no dedicated imaging follow-up is recommended) . Left Kidney: Length: 12.3 cm. Echogenicity within normal limits. No mass or hydronephrosis visualized. Abdominal aorta: No aneurysm visualized. Other findings: None. IMPRESSION: 1. Hepatic steatosis. Electronically Signed   By: Corean Salter M.D.   On: 03/19/2024 12:15       Assessment & Plan:  Hypertension, essential Assessment & Plan: Did not tolerate additional 12.5mg  hydrochlorothiazide . Last visit, diovan /hydrochlorothiazide  changed from 160/12.5  to 320/12.5. blood pressure remaining elevated above goal. Pulse rate 90-100. Discussed treatment options. Will start low dose carvedilol  3.125mg  bid. Follow pressures. Send in readings. Can titrate dose if needed.    Hypercholesteremia Assessment & Plan: Low cholesterol diet and exercise. Follow lipid panel.    Thyroid  nodule Assessment & Plan:  S/p thyroid  biopsy - benign (evaluated 01/2022).  Recommended f/u thyroid  ultrasound and appt in one year.  Overdue f/u. Has wanted to hold scheduling. Agreeable now.   Orders: -     Ambulatory referral to Endocrinology  Other orders -     Carvedilol ; Take 1 tablet (3.125 mg total) by mouth 2 (two) times daily with a meal.  Dispense: 60 tablet; Refill: 2     Allena Hamilton, MD

## 2024-04-11 ENCOUNTER — Encounter: Payer: Self-pay | Admitting: Internal Medicine

## 2024-04-11 MED ORDER — CARVEDILOL 6.25 MG PO TABS: 6.2500 mg | ORAL_TABLET | Freq: Two times a day (BID) | ORAL | 1 refills | Status: DC

## 2024-04-11 NOTE — Telephone Encounter (Signed)
Med pended for approval

## 2024-04-11 NOTE — Telephone Encounter (Signed)
 Rx sent in for carvedilol  6.25mg  bid.

## 2024-04-11 NOTE — Telephone Encounter (Signed)
 Please confirm how her heart rate is doing if she has a readings. She is currently on carvedilol  3.125mg  bid.  Can increase to 6.25g bid. She can take two of the 3.125mg  tablets twice a day and will need 6.25mg  bid sent in to pharmacy.  I can send in if she is agreeable to increase the dose. Continue to check and record blood pressure and pulse. Please confirm no acute symptoms. If acute symptoms needs to be evaluated.

## 2024-04-12 ENCOUNTER — Ambulatory Visit
Admission: RE | Admit: 2024-04-12 | Discharge: 2024-04-12 | Disposition: A | Discharge status: Home / Self Care | Source: Ambulatory Visit | Attending: Internal Medicine | Admitting: Internal Medicine

## 2024-04-12 DIAGNOSIS — Z1231 Encounter for screening mammogram for malignant neoplasm of breast: Secondary | ICD-10-CM | POA: Diagnosis present

## 2024-05-03 ENCOUNTER — Ambulatory Visit: Admitting: Internal Medicine

## 2024-05-03 VITALS — BP 120/80 | HR 80 | Resp 16 | Ht 69.0 in | Wt 278.0 lb

## 2024-05-03 DIAGNOSIS — I10 Essential (primary) hypertension: Secondary | ICD-10-CM | POA: Diagnosis not present

## 2024-05-03 DIAGNOSIS — F439 Reaction to severe stress, unspecified: Secondary | ICD-10-CM | POA: Diagnosis not present

## 2024-05-03 DIAGNOSIS — E7800 Pure hypercholesterolemia, unspecified: Secondary | ICD-10-CM

## 2024-05-03 DIAGNOSIS — E041 Nontoxic single thyroid nodule: Secondary | ICD-10-CM | POA: Diagnosis not present

## 2024-05-03 DIAGNOSIS — M4317 Spondylolisthesis, lumbosacral region: Secondary | ICD-10-CM

## 2024-05-03 DIAGNOSIS — Z1211 Encounter for screening for malignant neoplasm of colon: Secondary | ICD-10-CM

## 2024-05-03 NOTE — Assessment & Plan Note (Signed)
 S/p L5-S1 decompression/fusion - [facility]. Overall feels stable. Planning f/u with orho.

## 2024-05-03 NOTE — Assessment & Plan Note (Signed)
 Continues on zoloft . Stress is better. Follow.

## 2024-05-03 NOTE — Assessment & Plan Note (Signed)
 Low cholesterol diet and exercise.  Follow lipid panel.

## 2024-05-03 NOTE — Assessment & Plan Note (Addendum)
 S/p thyroid  biopsy - benign (evaluated 01/2022).  Recommended f/u thyroid  ultrasound and appt in one year.  Overdue f/u. Has wanted to hold scheduling. She is agreeable  - will call to schedule.

## 2024-05-03 NOTE — Progress Notes (Signed)
 Subjective:    Patient ID: Cindy Martin, female    DOB: [birth year], 53 y.o.   MRN: [id number]  Patient here for  Chief Complaint  Patient presents with   Medical Management of Chronic Issues    HPI Here for a scheduled follow up. Last visit, started carvedilol  for persistent elevated blood pressure. Continues on diovan /hydrochlorothiazide . Recently carvedilol  increased to 6.25mg . tolerating the medication. Blood pressures doing better. Most outside readings averaging 105-130/60-80s. No head/ear pain like she was experiencing previously. No chest pain or sob reported. No abdominal pain or bowel change reported. Stress better.    Past Medical History:  Diagnosis Date   Back pain 10/05/2023   Chicken pox    COVID 11/08/2023   Hypercholesteremia 11/09/2023   Hypertension    Menorrhagia    Thyroid  nodule 01/2022   benign   Past Surgical History:  Procedure Laterality Date   COLONOSCOPY WITH PROPOFOL  N/A 10/24/2021   Procedure: COLONOSCOPY WITH PROPOFOL ;  Surgeon: Therisa Bi, MD;  Location: [facility] ENDOSCOPY;  Service: Gastroenterology;  Laterality: N/A;   CYSTOSCOPY N/A 01/30/2020   Procedure: CYSTOSCOPY;  Surgeon: Arloa Lamar SQUIBB, MD;  Location: [facility] ORS;  Service: Gynecology;  Laterality: N/A;   TOTAL LAPAROSCOPIC HYSTERECTOMY WITH SALPINGECTOMY Bilateral 01/30/2020   Procedure: TOTAL LAPAROSCOPIC HYSTERECTOMY WITH RIGHT SALPINGECTOMY;  Surgeon: Arloa Lamar SQUIBB, MD;  Location: [facility] ORS;  Service: Gynecology;  Laterality: Bilateral;   TUBAL LIGATION     Tube reversal     Family History  Problem Relation Age of Onset   Stroke Mother    Hypertension Mother    Arthritis Mother    Hypertension Sister    Asthma Daughter    Breast cancer Paternal Grandmother        63-50   Ovarian cancer Neg Hx    Social History   Socioeconomic History   Marital status: Married    Spouse name: Not on file   Number of children: Not on file   Years of education: Not on file   Highest  education level: GED or equivalent  Occupational History   Occupation: Manufacturing systems engineer  Tobacco Use   Smoking status: Never   Smokeless tobacco: Never  Vaping Use   Vaping status: Never Used  Substance and Sexual Activity   Alcohol use: No   Drug use: No   Sexual activity: Not Currently    Birth control/protection: Surgical    Comment: Tubal ligation/reversed  Other Topics Concern   Not on file  Social History Narrative   Not on file   Social Drivers of Health   Financial Resource Strain: Low Risk  (05/03/2024)   Overall Financial Resource Strain (CARDIA)    Difficulty of Paying Living Expenses: Not hard at all  Food Insecurity: No Food Insecurity (05/03/2024)   Hunger Vital Sign    Worried About Running Out of Food in the Last Year: Never true    Ran Out of Food in the Last Year: Never true  Transportation Needs: No Transportation Needs (05/03/2024)   PRAPARE - Administrator, Civil Service (Medical): No    Lack of Transportation (Non-Medical): No  Physical Activity: Inactive (05/03/2024)   Exercise Vital Sign    Days of Exercise per Week: 0 days    Minutes of Exercise per Session: Not on file  Stress: No Stress Concern Present (05/03/2024)   [employer] of Occupational Health - Occupational Stress Questionnaire    Feeling of Stress: Not at all  Social Connections: Socially  Integrated (05/03/2024)   Social Connection and Isolation Panel    Frequency of Communication with Friends and Family: More than three times a week    Frequency of Social Gatherings with Friends and Family: More than three times a week    Attends Religious Services: More than 4 times per year    Active Member of [employer] or Organizations: Yes    Attends Banker Meetings: 1 to 4 times per year    Marital Status: Married     Review of Systems  Constitutional:  Negative for appetite change and unexpected weight change.  HENT:  Negative for congestion and sinus  pressure.   Respiratory:  Negative for cough, chest tightness and shortness of breath.   Cardiovascular:  Negative for chest pain and palpitations.       No increased swelling.   Gastrointestinal:  Negative for abdominal pain, diarrhea, nausea and vomiting.  Genitourinary:  Negative for difficulty urinating and dysuria.  Musculoskeletal:  Negative for joint swelling and myalgias.  Skin:  Negative for color change and rash.  Neurological:  Negative for dizziness and headaches.  Psychiatric/Behavioral:  Negative for agitation and dysphoric mood.        Objective:     BP 120/80   Pulse 80   Resp 16   Ht 5' 9 (1.753 m)   Wt 278 lb (126.1 kg)   LMP 01/02/2020 (Exact Date)   SpO2 98%   BMI 41.05 kg/m  Wt Readings from Last 3 Encounters:  05/03/24 278 lb (126.1 kg)  04/04/24 276 lb (125.2 kg)  03/16/24 274 lb 12.8 oz (124.6 kg)    Physical Exam Vitals reviewed.  Constitutional:      General: She is not in acute distress.    Appearance: Normal appearance.  HENT:     Head: Normocephalic and atraumatic.     Right Ear: External ear normal.     Left Ear: External ear normal.     Mouth/Throat:     Pharynx: No oropharyngeal exudate or posterior oropharyngeal erythema.  Eyes:     General: No scleral icterus.       Right eye: No discharge.        Left eye: No discharge.     Conjunctiva/sclera: Conjunctivae normal.  Neck:     Thyroid : No thyromegaly.  Cardiovascular:     Rate and Rhythm: Normal rate and regular rhythm.  Pulmonary:     Effort: No respiratory distress.     Breath sounds: Normal breath sounds. No wheezing.  Abdominal:     General: Bowel sounds are normal.     Palpations: Abdomen is soft.     Tenderness: There is no abdominal tenderness.  Musculoskeletal:        General: No swelling or tenderness.     Cervical back: Neck supple. No tenderness.     Comments: DP pulses palpable and equal bilaterally.   Lymphadenopathy:     Cervical: No cervical adenopathy.   Skin:    Findings: No erythema or rash.  Neurological:     Mental Status: She is alert.  Psychiatric:        Mood and Affect: Mood normal.        Behavior: Behavior normal.         Outpatient Encounter Medications as of 05/03/2024  Medication Sig   amLODipine  (NORVASC ) 5 MG tablet Take 1 tablet (5 mg total) by mouth daily.   busPIRone  (BUSPAR ) 5 MG tablet Take 1 tablet (5 mg total) by  mouth 2 (two) times daily.   carvedilol  (COREG ) 6.25 MG tablet Take 1 tablet (6.25 mg total) by mouth 2 (two) times daily with a meal.   cyclobenzaprine  (FLEXERIL ) 10 MG tablet Take 1 tablet (10 mg total) by mouth 3 (three) times daily as needed for muscle spasms.   LYRICA 25 MG capsule Take 25 mg by mouth 2 (two) times daily.   oxyCODONE -acetaminophen  (PERCOCET) 5-325 MG tablet Take 1-2 tablets by mouth every 4 (four) hours as needed.   sertraline  (ZOLOFT ) 100 MG tablet Take 1 tablet (100 mg total) by mouth daily.   valsartan -hydrochlorothiazide  (DIOVAN -HCT) 320-12.5 MG tablet Take 1 tablet by mouth daily.   No facility-administered encounter medications on file as of 05/03/2024.     Lab Results  Component Value Date   WBC 8.0 02/14/2024   HGB 14.9 02/14/2024   HCT 44.1 02/14/2024   PLT 265.0 02/14/2024   GLUCOSE 100 (H) 03/06/2024   CHOL 250 (H) 02/14/2024   TRIG 321.0 (H) 02/14/2024   HDL 39.70 02/14/2024   LDLCALC 146 (H) 02/14/2024   ALT 49 (H) 03/06/2024   AST 31 03/06/2024   NA 139 03/06/2024   K 3.6 03/06/2024   CL 102 03/06/2024   CREATININE 1.07 03/06/2024   BUN 24 (H) 03/06/2024   CO2 27 03/06/2024   TSH 0.73 08/03/2023   HGBA1C 5.6 12/12/2019    MM 3D SCREENING MAMMOGRAM BILATERAL BREAST Result Date: 04/13/2024 CLINICAL DATA:  Screening. EXAM: DIGITAL SCREENING BILATERAL MAMMOGRAM WITH TOMOSYNTHESIS AND CAD TECHNIQUE: Bilateral screening digital craniocaudal and mediolateral oblique mammograms were obtained. Bilateral screening digital breast tomosynthesis was performed.  The images were evaluated with computer-aided detection. COMPARISON:  Previous exam(s). ACR Breast Density Category b: There are scattered areas of fibroglandular density. FINDINGS: There are no findings suspicious for malignancy. IMPRESSION: No mammographic evidence of malignancy. A result letter of this screening mammogram will be mailed directly to the patient. RECOMMENDATION: Screening mammogram in one year. (Code:SM-B-01Y) BI-RADS CATEGORY  1: Negative. Electronically Signed   By: Inocente Ast M.D.   On: 04/13/2024 14:35       Assessment & Plan:  Thyroid  nodule Assessment & Plan:  S/p thyroid  biopsy - benign (evaluated 01/2022).  Recommended f/u thyroid  ultrasound and appt in one year.  Overdue f/u. Has wanted to hold scheduling. She is agreeable  - will call to schedule.    Stress Assessment & Plan: Continues on zoloft . Stress is better. Follow.    Spondylolisthesis of lumbosacral region Assessment & Plan: S/p L5-S1 decompression/fusion - Emerge. Overall feels stable. Planning f/u with orho.    Hypertension, essential Assessment & Plan: Blood pressure doing better on current regimen - diovan /hydrochlorothiazide  and carvedilol . Blood pressures as outlined. No change in medication today. Continue to spot check pressures. Follow metabolic panel.    Hypercholesteremia Assessment & Plan: Low cholesterol diet and exercise. Follow lipid panel.    Colon cancer screening Assessment & Plan: Colonoscopy 10/2021 - recommended f/u in 3 years.       Allena Hamilton, MD

## 2024-05-03 NOTE — Assessment & Plan Note (Signed)
 Blood pressure doing better on current regimen - diovan /hydrochlorothiazide  and carvedilol . Blood pressures as outlined. No change in medication today. Continue to spot check pressures. Follow metabolic panel.

## 2024-05-03 NOTE — Assessment & Plan Note (Signed)
Colonoscopy 10/2021 - recommended f/u in 3 years.

## 2024-05-09 ENCOUNTER — Other Ambulatory Visit: Payer: Self-pay | Admitting: Internal Medicine

## 2024-05-17 ENCOUNTER — Encounter: Payer: Self-pay | Admitting: Internal Medicine

## 2024-05-17 NOTE — Telephone Encounter (Signed)
 The amlodipine  can cause some swelling, but need to confirm if she is eating an increased amount of foods with higher sodium. Also, have her monitor her weight. Can schedule an appt with me to discuss her blood pressure and if changes are needed.

## 2024-05-22 ENCOUNTER — Ambulatory Visit: Admitting: Internal Medicine

## 2024-05-22 ENCOUNTER — Telehealth: Payer: Self-pay | Admitting: Internal Medicine

## 2024-05-22 ENCOUNTER — Encounter: Payer: Self-pay | Admitting: Internal Medicine

## 2024-05-22 VITALS — BP 110/80 | HR 76 | Temp 98.2°F | Ht 69.0 in | Wt 277.8 lb

## 2024-05-22 DIAGNOSIS — N23 Unspecified renal colic: Secondary | ICD-10-CM | POA: Diagnosis not present

## 2024-05-22 DIAGNOSIS — R10A1 Flank pain, right side: Secondary | ICD-10-CM | POA: Diagnosis not present

## 2024-05-22 DIAGNOSIS — E7800 Pure hypercholesterolemia, unspecified: Secondary | ICD-10-CM

## 2024-05-22 DIAGNOSIS — Z1211 Encounter for screening for malignant neoplasm of colon: Secondary | ICD-10-CM

## 2024-05-22 DIAGNOSIS — M545 Low back pain, unspecified: Secondary | ICD-10-CM

## 2024-05-22 DIAGNOSIS — R10A0 Flank pain, unspecified side: Secondary | ICD-10-CM | POA: Insufficient documentation

## 2024-05-22 DIAGNOSIS — E041 Nontoxic single thyroid nodule: Secondary | ICD-10-CM

## 2024-05-22 DIAGNOSIS — I10 Essential (primary) hypertension: Secondary | ICD-10-CM

## 2024-05-22 LAB — POC URINALSYSI DIPSTICK (AUTOMATED)
Bilirubin, UA: NEGATIVE
Blood, UA: NEGATIVE
Glucose, UA: NEGATIVE
Ketones, UA: NEGATIVE
Leukocytes, UA: NEGATIVE
Nitrite, UA: NEGATIVE
Protein, UA: NEGATIVE
Spec Grav, UA: 1.01 (ref 1.010–1.025)
Urobilinogen, UA: 0.2 U/dL
pH, UA: 6 (ref 5.0–8.0)

## 2024-05-22 MED ORDER — CYCLOBENZAPRINE HCL 10 MG PO TABS: 10.0000 mg | ORAL_TABLET | Freq: Two times a day (BID) | ORAL | 0 refills | Status: AC | PRN

## 2024-05-22 NOTE — Progress Notes (Signed)
 Subjective:    Patient ID: Cindy Martin, female    DOB: Jan 03, 1971, 53 y.o.   MRN: [id number]  Patient here for  Chief Complaint  Patient presents with   Flank Pain    C/O right kidney pain since late Thursday afternoon. No other symptoms, no urinary sx's    HPI Work in appt - work in with concerns regarding my right kidney has been hurting. Had called in regarding increased swelling. Blood pressure doing better. Saw endocrinology 05/15/24 - f/u thyroid  nodules. Recommended f/u neck ultrasound and tsh. Noticed last week - started having right side low back pain. No dysuria. No hematuria. Appears to be more positional. Tool tylenol  ES last night. No chest pain. Breathing stable. Does reports some increased lower extremity swelling. Due to see [facility] this week. Discussed compression hose and leg elevation.    Past Medical History:  Diagnosis Date   Back pain 10/05/2023   Chicken pox    COVID 11/08/2023   Hypercholesteremia 11/09/2023   Hypertension    Menorrhagia    Thyroid  nodule 01/2022   benign   Past Surgical History:  Procedure Laterality Date   COLONOSCOPY WITH PROPOFOL  N/A 10/24/2021   Procedure: COLONOSCOPY WITH PROPOFOL ;  Surgeon: Therisa Bi, MD;  Location: [facility] ENDOSCOPY;  Service: Gastroenterology;  Laterality: N/A;   CYSTOSCOPY N/A 01/30/2020   Procedure: CYSTOSCOPY;  Surgeon: Arloa Lamar SQUIBB, MD;  Location: [facility] ORS;  Service: Gynecology;  Laterality: N/A;   TOTAL LAPAROSCOPIC HYSTERECTOMY WITH SALPINGECTOMY Bilateral 01/30/2020   Procedure: TOTAL LAPAROSCOPIC HYSTERECTOMY WITH RIGHT SALPINGECTOMY;  Surgeon: Arloa Lamar SQUIBB, MD;  Location: [facility] ORS;  Service: Gynecology;  Laterality: Bilateral;   TUBAL LIGATION     Tube reversal     Family History  Problem Relation Age of Onset   Stroke Mother    Hypertension Mother    Arthritis Mother    Hypertension Sister    Asthma Daughter    Breast cancer Paternal Grandmother        59-50   Ovarian cancer Neg Hx     Social History   Socioeconomic History   Marital status: Married    Spouse name: Not on file   Number of children: Not on file   Years of education: Not on file   Highest education level: GED or equivalent  Occupational History   Occupation: Manufacturing Systems Engineer  Tobacco Use   Smoking status: Never   Smokeless tobacco: Never  Vaping Use   Vaping status: Never Used  Substance and Sexual Activity   Alcohol use: No   Drug use: No   Sexual activity: Not Currently    Birth control/protection: Surgical    Comment: Tubal ligation/reversed  Other Topics Concern   Not on file  Social History Narrative   Not on file   Social Drivers of Health   Financial Resource Strain: Low Risk  (05/15/2024)   Received [facility]   Overall Financial Resource Strain (CARDIA)    Difficulty of Paying Living Expenses: Not hard at all  Food Insecurity: No Food Insecurity (05/15/2024)   Received from [facility]   Hunger Vital Sign    Within the past 12 months, you worried that your food would run out before you got the money to buy more.: Never true    Within the past 12 months, the food you bought just didn't last and you didn't have money to get more.: Never true  Transportation Needs: No Transportation Needs (05/15/2024)  Received from [facility] - Transportation    In the past 12 months, has lack of transportation kept you from medical appointments or from getting medications?: No    Lack of Transportation (Non-Medical): No  Physical Activity: Inactive (05/03/2024)   Exercise Vital Sign    Days of Exercise per Week: 0 days    Minutes of Exercise per Session: Not on file  Stress: No Stress Concern Present (05/03/2024)   [employer] of Occupational Health - Occupational Stress Questionnaire    Feeling of Stress: Not at all  Social Connections: Socially Integrated (05/03/2024)   Social Connection and Isolation Panel     Frequency of Communication with Friends and Family: More than three times a week    Frequency of Social Gatherings with Friends and Family: More than three times a week    Attends Religious Services: More than 4 times per year    Active Member of [employer] or Organizations: Yes    Attends Banker Meetings: 1 to 4 times per year    Marital Status: Married     Review of Systems  Constitutional:  Negative for appetite change and unexpected weight change.  HENT:  Negative for congestion and sinus pressure.   Respiratory:  Negative for cough, chest tightness and shortness of breath.   Cardiovascular:  Negative for chest pain and palpitations.       Concerns lower extremity swelling.   Gastrointestinal:  Negative for abdominal pain, diarrhea, nausea and vomiting.  Genitourinary:  Negative for difficulty urinating and dysuria.  Musculoskeletal:  Negative for joint swelling and myalgias.  Skin:  Negative for color change and rash.  Neurological:  Negative for dizziness and headaches.  Psychiatric/Behavioral:  Negative for agitation and dysphoric mood.        Objective:     BP 110/80   Pulse 76   Temp 98.2 F (36.8 C) (Oral)   Ht 5' 9 (1.753 m)   Wt 277 lb 12 oz (126 kg)   LMP 01/02/2020 (Exact Date)   SpO2 97%   BMI 41.02 kg/m  Wt Readings from Last 3 Encounters:  05/26/24 278 lb (126.1 kg)  05/22/24 277 lb 12 oz (126 kg)  05/03/24 278 lb (126.1 kg)    Physical Exam Vitals reviewed.  Constitutional:      General: She is not in acute distress.    Appearance: Normal appearance.  HENT:     Head: Normocephalic and atraumatic.     Right Ear: External ear normal.     Left Ear: External ear normal.     Mouth/Throat:     Pharynx: No oropharyngeal exudate or posterior oropharyngeal erythema.  Eyes:     General: No scleral icterus.       Right eye: No discharge.        Left eye: No discharge.     Conjunctiva/sclera: Conjunctivae normal.  Neck:     Thyroid : No  thyromegaly.  Cardiovascular:     Rate and Rhythm: Normal rate and regular rhythm.  Pulmonary:     Effort: No respiratory distress.     Breath sounds: Normal breath sounds. No wheezing.  Abdominal:     General: Bowel sounds are normal.     Palpations: Abdomen is soft.     Tenderness: There is no abdominal tenderness.  Musculoskeletal:        General: No tenderness.     Cervical back: Neck supple. No tenderness.     Comments: Pedal/lower  extremity swelling - stable.   Lymphadenopathy:     Cervical: No cervical adenopathy.  Skin:    Findings: No erythema or rash.  Neurological:     Mental Status: She is alert.  Psychiatric:        Mood and Affect: Mood normal.        Behavior: Behavior normal.         Outpatient Encounter Medications as of 05/22/2024  Medication Sig   amLODipine  (NORVASC ) 5 MG tablet Take 1 tablet (5 mg total) by mouth daily.   busPIRone  (BUSPAR ) 5 MG tablet Take 1 tablet (5 mg total) by mouth 2 (two) times daily.   carvedilol  (COREG ) 6.25 MG tablet TAKE 1 TABLET BY MOUTH 2 TIMES DAILY WITH A MEAL.   LYRICA 25 MG capsule Take 25 mg by mouth 2 (two) times daily. (Patient taking differently: Take 25 mg by mouth 2 (two) times daily as needed.)   oxyCODONE -acetaminophen  (PERCOCET) 5-325 MG tablet Take 1-2 tablets by mouth every 4 (four) hours as needed.   sertraline  (ZOLOFT ) 100 MG tablet Take 1 tablet (100 mg total) by mouth daily.   valsartan -hydrochlorothiazide  (DIOVAN -HCT) 320-12.5 MG tablet Take 1 tablet by mouth daily.   [DISCONTINUED] cyclobenzaprine  (FLEXERIL ) 10 MG tablet Take 1 tablet (10 mg total) by mouth 3 (three) times daily as needed for muscle spasms.   cyclobenzaprine  (FLEXERIL ) 10 MG tablet Take 1 tablet (10 mg total) by mouth 2 (two) times daily as needed for muscle spasms.   No facility-administered encounter medications on file as of 05/22/2024.     Lab Results  Component Value Date   WBC 8.0 02/14/2024   HGB 14.9 02/14/2024   HCT 44.1  02/14/2024   PLT 265.0 02/14/2024   GLUCOSE 100 (H) 03/06/2024   CHOL 250 (H) 02/14/2024   TRIG 321.0 (H) 02/14/2024   HDL 39.70 02/14/2024   LDLCALC 146 (H) 02/14/2024   ALT 49 (H) 03/06/2024   AST 31 03/06/2024   NA 139 03/06/2024   K 3.6 03/06/2024   CL 102 03/06/2024   CREATININE 1.07 03/06/2024   BUN 24 (H) 03/06/2024   CO2 27 03/06/2024   TSH 0.73 08/03/2023   HGBA1C 5.6 12/12/2019    MM 3D SCREENING MAMMOGRAM BILATERAL BREAST Result Date: 04/13/2024 CLINICAL DATA:  Screening. EXAM: DIGITAL SCREENING BILATERAL MAMMOGRAM WITH TOMOSYNTHESIS AND CAD TECHNIQUE: Bilateral screening digital craniocaudal and mediolateral oblique mammograms were obtained. Bilateral screening digital breast tomosynthesis was performed. The images were evaluated with computer-aided detection. COMPARISON:  Previous exam(s). ACR Breast Density Category b: There are scattered areas of fibroglandular density. FINDINGS: There are no findings suspicious for malignancy. IMPRESSION: No mammographic evidence of malignancy. A result letter of this screening mammogram will be mailed directly to the patient. RECOMMENDATION: Screening mammogram in one year. (Code:SM-B-01Y) BI-RADS CATEGORY  1: Negative. Electronically Signed   By: Inocente Ast M.D.   On: 04/13/2024 14:35       Assessment & Plan:  Kidney pain -     POCT Urinalysis Dipstick (Automated)  Right flank pain Assessment & Plan: Right low back pain as outlined. Appears to be more msk. Urine dip ok. Send culture. Hold abx. Continue tylenol .   Orders: -     Urine Microscopic -     Urine Culture  Low back pain, unspecified back pain laterality, unspecified chronicity, unspecified whether sciatica present Assessment & Plan: Right low back pain - appears to be more msk. Continue tylenol . Stretches. Urine dip - ok. Send for culture.  Call with update.    Colon cancer screening Assessment & Plan: Colonoscopy 10/2021 - recommended f/u in 3 years.     Hypercholesteremia Assessment & Plan: Low cholesterol diet and exercise. Follow lipid panel.    Hypertension, essential Assessment & Plan: Blood pressure doing better on current regimen - diovan /hydrochlorothiazide  and carvedilol . Blood pressures as outlined. No change in medication today. Continue to spot check pressures.  Follow metabolic panel.    Thyroid  nodule Assessment & Plan:  S/p thyroid  biopsy - benign. Saw endocrinology 05/15/24 - f/u thyroid  nodules. Recommended f/u neck ultrasound and tsh.    Other orders -     Cyclobenzaprine  HCl; Take 1 tablet (10 mg total) by mouth 2 (two) times daily as needed for muscle spasms.  Dispense: 20 tablet; Refill: 0     Allena Hamilton, MD

## 2024-05-22 NOTE — Telephone Encounter (Signed)
 Letter printed.

## 2024-05-23 ENCOUNTER — Encounter: Payer: Self-pay | Admitting: Internal Medicine

## 2024-05-23 ENCOUNTER — Ambulatory Visit: Payer: Self-pay | Admitting: Internal Medicine

## 2024-05-23 LAB — URINE CULTURE
MICRO NUMBER:: 17182091
Result:: NO GROWTH
SPECIMEN QUALITY:: ADEQUATE

## 2024-05-23 LAB — URINALYSIS, MICROSCOPIC ONLY: RBC / HPF: NONE SEEN

## 2024-05-24 NOTE — Telephone Encounter (Signed)
 See lab result note to address her questions and her urine results. Urine culture negative for infection.

## 2024-05-25 ENCOUNTER — Other Ambulatory Visit (INDEPENDENT_AMBULATORY_CARE_PROVIDER_SITE_OTHER): Payer: Self-pay | Admitting: Vascular Surgery

## 2024-05-25 DIAGNOSIS — M7989 Other specified soft tissue disorders: Secondary | ICD-10-CM

## 2024-05-26 ENCOUNTER — Ambulatory Visit (INDEPENDENT_AMBULATORY_CARE_PROVIDER_SITE_OTHER): Payer: Self-pay | Admitting: Vascular Surgery

## 2024-05-26 ENCOUNTER — Encounter (INDEPENDENT_AMBULATORY_CARE_PROVIDER_SITE_OTHER): Payer: Self-pay | Admitting: Vascular Surgery

## 2024-05-26 ENCOUNTER — Other Ambulatory Visit (INDEPENDENT_AMBULATORY_CARE_PROVIDER_SITE_OTHER): Payer: Self-pay

## 2024-05-26 VITALS — BP 110/78 | HR 80 | Resp 18 | Wt 278.0 lb

## 2024-05-26 DIAGNOSIS — M7989 Other specified soft tissue disorders: Secondary | ICD-10-CM

## 2024-05-26 DIAGNOSIS — M545 Low back pain, unspecified: Secondary | ICD-10-CM | POA: Diagnosis not present

## 2024-05-26 DIAGNOSIS — I10 Essential (primary) hypertension: Secondary | ICD-10-CM

## 2024-05-26 NOTE — Assessment & Plan Note (Signed)
 Venous reflux study was performed today showing no evidence of deep venous thrombosis, superficial thrombophlebitis, or venous reflux in either lower extremity.  This is encouraging.  Her swelling is likely largely due to her major back surgery earlier this year and resultant immobility and dependency of the legs.  I have recommended she wear 20 to 30 mmHg compression socks daily.  A lower degree of compression could also be used with some benefit.  Leg elevation increasing her activity would also be of significant benefit.  I will see her back as needed.

## 2024-05-26 NOTE — Assessment & Plan Note (Signed)
 Major back surgery earlier this year.

## 2024-05-26 NOTE — Progress Notes (Signed)
 Patient ID: Cindy Martin, female   DOB: Dec 14, 1970, 53 y.o.   MRN: [id number]  Chief Complaint  Patient presents with   Follow-up    Pt conv bilateral reflux u/s follow up    HPI Cindy Martin is a 53 y.o. female.  I am asked to see the patient by Dr. Glendia for evaluation of leg swelling.  The patient has had progressive swelling over the past couple of months.  She had a major back surgery for congenital pars defect earlier this year.  This was a very involved surgery with fusion and resulted in significant immobility for an extended period of time.  Her walking is back to normal pretty much at this point.  She does still have to sit or stand a lot for her job.  She has noticed over the past several weeks to months that both lower extremities have been swelling more.  No open wounds or infection.  No weeping of the tissue.  No chest pain or shortness of breath.     Past Medical History:  Diagnosis Date   Back pain 10/05/2023   Chicken pox    COVID 11/08/2023   Hypercholesteremia 11/09/2023   Hypertension    Menorrhagia    Thyroid  nodule 01/2022   benign    Past Surgical History:  Procedure Laterality Date   COLONOSCOPY WITH PROPOFOL  N/A 10/24/2021   Procedure: COLONOSCOPY WITH PROPOFOL ;  Surgeon: Therisa Bi, MD;  Location: [facility] ENDOSCOPY;  Service: Gastroenterology;  Laterality: N/A;   CYSTOSCOPY N/A 01/30/2020   Procedure: CYSTOSCOPY;  Surgeon: Arloa Lamar SQUIBB, MD;  Location: [facility] ORS;  Service: Gynecology;  Laterality: N/A;   TOTAL LAPAROSCOPIC HYSTERECTOMY WITH SALPINGECTOMY Bilateral 01/30/2020   Procedure: TOTAL LAPAROSCOPIC HYSTERECTOMY WITH RIGHT SALPINGECTOMY;  Surgeon: Arloa Lamar SQUIBB, MD;  Location: [facility] ORS;  Service: Gynecology;  Laterality: Bilateral;   TUBAL LIGATION     Tube reversal    Repair of Pars defect/spinal fusion 2025   Family History  Problem Relation Age of Onset   Stroke Mother    Hypertension Mother    Arthritis Mother     Hypertension Sister    Asthma Daughter    Breast cancer Paternal Grandmother        36-50   Ovarian cancer Neg Hx       Social History   Tobacco Use   Smoking status: Never   Smokeless tobacco: Never  Vaping Use   Vaping status: Never Used  Substance Use Topics   Alcohol use: No   Drug use: No     Allergies  Allergen Reactions   Gabapentin  Swelling    Current Outpatient Medications  Medication Sig Dispense Refill   amLODipine  (NORVASC ) 5 MG tablet Take 1 tablet (5 mg total) by mouth daily. 90 tablet 1   busPIRone  (BUSPAR ) 5 MG tablet Take 1 tablet (5 mg total) by mouth 2 (two) times daily. 180 tablet 1   carvedilol  (COREG ) 6.25 MG tablet TAKE 1 TABLET BY MOUTH 2 TIMES DAILY WITH A MEAL. 180 tablet 1   cyclobenzaprine  (FLEXERIL ) 10 MG tablet Take 1 tablet (10 mg total) by mouth 2 (two) times daily as needed for muscle spasms. 20 tablet 0   LYRICA 25 MG capsule Take 25 mg by mouth 2 (two) times daily. (Patient taking differently: Take 25 mg by mouth 2 (two) times daily as needed.)     oxyCODONE -acetaminophen  (PERCOCET) 5-325 MG tablet Take 1-2 tablets by mouth every 4 (four) hours as needed.  30 tablet 0   sertraline  (ZOLOFT ) 100 MG tablet Take 1 tablet (100 mg total) by mouth daily. 90 tablet 2   valsartan -hydrochlorothiazide  (DIOVAN -HCT) 320-12.5 MG tablet Take 1 tablet by mouth daily. 90 tablet 1   No current facility-administered medications for this visit.      REVIEW OF SYSTEMS (Negative unless checked)  Constitutional: [] Weight loss  [] Fever  [] Chills Cardiac: [] Chest pain   [] Chest pressure   [] Palpitations   [] Shortness of breath when laying flat   [] Shortness of breath at rest   [] Shortness of breath with exertion. Vascular:  [] Pain in legs with walking   [] Pain in legs at rest   [] Pain in legs when laying flat   [] Claudication   [] Pain in feet when walking  [] Pain in feet at rest  [] Pain in feet when laying flat   [] History of DVT   [] Phlebitis   [x] Swelling in  legs   [] Varicose veins   [] Non-healing ulcers Pulmonary:   [] Uses home oxygen   [] Productive cough   [] Hemoptysis   [] Wheeze  [] COPD   [] Asthma Neurologic:  [] Dizziness  [] Blackouts   [] Seizures   [] History of stroke   [] History of TIA  [] Aphasia   [] Temporary blindness   [] Dysphagia   [] Weakness or numbness in arms   [] Weakness or numbness in legs Musculoskeletal:  [] Arthritis   [] Joint swelling   [x] Joint pain   [x] Low back pain Hematologic:  [] Easy bruising  [] Easy bleeding   [] Hypercoagulable state   [] Anemic  [] Hepatitis Gastrointestinal:  [] Blood in stool   [] Vomiting blood  [] Gastroesophageal reflux/heartburn   [] Abdominal pain Genitourinary:  [] Chronic kidney disease   [] Difficult urination  [] Frequent urination  [] Burning with urination   [] Hematuria Skin:  [] Rashes   [] Ulcers   [] Wounds Psychological:  [] History of anxiety   []  History of major depression.    Physical Exam BP 110/78 (BP Location: Left Arm)   Pulse 80   Resp 18   Wt 278 lb (126.1 kg)   LMP 01/02/2020 (Exact Date)   BMI 41.05 kg/m  Gen:  WD/WN, NAD Head: [place]/AT, No temporalis wasting.  Ear/Nose/Throat: Hearing grossly intact, nares w/o erythema or drainage, oropharynx w/o Erythema/Exudate Eyes: Conjunctiva clear, sclera non-icteric  Neck: trachea midline.  No JVD.  Pulmonary:  Good air movement, respirations not labored, no use of accessory muscles  Cardiac: RRR, no JVD Vascular:  Vessel Right Left  Radial Palpable Palpable                          DP 2+ 2+  PT 1+ 1+   Gastrointestinal:. No masses, surgical incisions, or scars. Musculoskeletal: M/S 5/5 throughout.  Extremities without ischemic changes.  No deformity or atrophy. BLE 1+ edema. Neurologic: Sensation grossly intact in extremities.  Symmetrical.  Speech is fluent. Motor exam as listed above. Psychiatric: Judgment intact, Mood & affect appropriate for pt's clinical situation. Dermatologic: No rashes or ulcers noted.  No cellulitis or open  wounds.    Radiology No results found.  Labs Recent Results (from the past 2160 hours)  Basic metabolic panel     Status: Abnormal   Collection Time: 03/06/24  9:35 AM  Result Value Ref Range   Sodium 139 135 - 145 mEq/L   Potassium 3.6 3.5 - 5.1 mEq/L   Chloride 102 96 - 112 mEq/L   CO2 27 19 - 32 mEq/L   Glucose, Bld 100 (H) 70 - 99 mg/dL   BUN 24 (H)  6 - 23 mg/dL   Creatinine, Ser 8.92 0.40 - 1.20 mg/dL   GFR 39.55 >39.99 mL/min    Comment: Calculated using the CKD-EPI Creatinine Equation (2021)   Calcium 9.2 8.4 - 10.5 mg/dL  Hepatic function panel     Status: Abnormal   Collection Time: 03/06/24  9:35 AM  Result Value Ref Range   Total Bilirubin 0.4 0.2 - 1.2 mg/dL   Bilirubin, Direct 0.1 0.0 - 0.3 mg/dL   Alkaline Phosphatase 91 39 - 117 U/L   AST 31 0 - 37 U/L   ALT 49 (H) 0 - 35 U/L   Total Protein 6.5 6.0 - 8.3 g/dL   Albumin 4.3 3.5 - 5.2 g/dL  POCT Urinalysis Dipstick (Automated)     Status: Normal   Collection Time: 05/22/24  2:28 PM  Result Value Ref Range   Color, UA yellow    Clarity, UA clear    Glucose, UA Negative Negative   Bilirubin, UA neg    Ketones, UA neg    Spec Grav, UA 1.010 1.010 - 1.025   Blood, UA neg    pH, UA 6.0 5.0 - 8.0   Protein, UA Negative Negative   Urobilinogen, UA 0.2 0.2 or 1.0 E.U./dL   Nitrite, UA neg    Leukocytes, UA Negative Negative  Urine Microscopic Only     Status: Abnormal   Collection Time: 05/22/24  2:58 PM  Result Value Ref Range   WBC, UA 0-2/hpf 0-2/hpf   RBC / HPF none seen 0-2/hpf   Mucus, UA Presence of (A) None   Squamous Epithelial / HPF Rare(0-4/hpf) Rare(0-4/hpf)   Bacteria, UA Rare(<10/hpf) (A) None  Urine Culture     Status: None   Collection Time: 05/22/24  2:58 PM   Specimen: Urine  Result Value Ref Range   MICRO NUMBER: [id number]    SPECIMEN QUALITY: Adequate    Sample Source NOT GIVEN    STATUS: FINAL    Result: No Growth     Assessment/Plan:  Swelling of limb Venous reflux study  was performed today showing no evidence of deep venous thrombosis, superficial thrombophlebitis, or venous reflux in either lower extremity.  This is encouraging.  Her swelling is likely largely due to her major back surgery earlier this year and resultant immobility and dependency of the legs.  I have recommended she wear 20 to 30 mmHg compression socks daily.  A lower degree of compression could also be used with some benefit.  Leg elevation increasing her activity would also be of significant benefit.  I will see her back as needed.  Back pain Major back surgery earlier this year.  Hypertension, essential blood pressure control important in reducing the progression of atherosclerotic disease. On appropriate oral medications.      Selinda Gu 05/26/2024, 2:09 PM   This note was created with Dragon medical transcription system.  Any errors from dictation are unintentional.

## 2024-05-26 NOTE — Assessment & Plan Note (Signed)
 blood pressure control important in reducing the progression of atherosclerotic disease. On appropriate oral medications.

## 2024-05-28 ENCOUNTER — Encounter: Payer: Self-pay | Admitting: Internal Medicine

## 2024-05-28 NOTE — Assessment & Plan Note (Signed)
Colonoscopy 10/2021 - recommended f/u in 3 years.

## 2024-05-28 NOTE — Assessment & Plan Note (Signed)
 Low cholesterol diet and exercise.  Follow lipid panel.

## 2024-05-28 NOTE — Assessment & Plan Note (Signed)
 Right low back pain as outlined. Appears to be more msk. Urine dip ok. Send culture. Hold abx. Continue tylenol .

## 2024-05-28 NOTE — Assessment & Plan Note (Signed)
 S/p thyroid  biopsy - benign. Saw endocrinology 05/15/24 - f/u thyroid  nodules. Recommended f/u neck ultrasound and tsh.

## 2024-05-28 NOTE — Assessment & Plan Note (Signed)
 Blood pressure doing better on current regimen - diovan /hydrochlorothiazide  and carvedilol . Blood pressures as outlined. No change in medication today. Continue to spot check pressures. Follow metabolic panel.

## 2024-05-28 NOTE — Assessment & Plan Note (Addendum)
 Right low back pain - appears to be more msk. Continue tylenol . Stretches. Urine dip - ok. Send for culture.  Call with update.

## 2024-06-05 ENCOUNTER — Encounter: Payer: Self-pay | Admitting: Internal Medicine

## 2024-07-04 ENCOUNTER — Other Ambulatory Visit (INDEPENDENT_AMBULATORY_CARE_PROVIDER_SITE_OTHER)

## 2024-07-04 DIAGNOSIS — E7800 Pure hypercholesterolemia, unspecified: Secondary | ICD-10-CM

## 2024-07-04 DIAGNOSIS — I10 Essential (primary) hypertension: Secondary | ICD-10-CM

## 2024-07-04 DIAGNOSIS — R232 Flushing: Secondary | ICD-10-CM

## 2024-07-04 LAB — HEPATIC FUNCTION PANEL
ALT: 53 U/L — ABNORMAL HIGH (ref 0–35)
AST: 33 U/L (ref 5–37)
Albumin: 4.6 g/dL (ref 3.5–5.2)
Alkaline Phosphatase: 90 U/L (ref 39–117)
Bilirubin, Direct: 0.1 mg/dL (ref 0.0–0.3)
Total Bilirubin: 0.4 mg/dL (ref 0.2–1.2)
Total Protein: 6.8 g/dL (ref 6.0–8.3)

## 2024-07-04 LAB — BASIC METABOLIC PANEL WITH GFR
BUN: 26 mg/dL — ABNORMAL HIGH (ref 6–23)
CO2: 28 meq/L (ref 19–32)
Calcium: 9.4 mg/dL (ref 8.4–10.5)
Chloride: 104 meq/L (ref 96–112)
Creatinine, Ser: 0.98 mg/dL (ref 0.40–1.20)
GFR: 67.01 mL/min (ref >60.00)
Glucose, Bld: 108 mg/dL — ABNORMAL HIGH (ref 70–99)
Potassium: 3.9 meq/L (ref 3.5–5.1)
Sodium: 140 meq/L (ref 135–145)

## 2024-07-04 LAB — LIPID PANEL
Cholesterol: 234 mg/dL — ABNORMAL HIGH (ref 28–200)
HDL: 51.7 mg/dL (ref >39.00)
LDL Cholesterol: 140 mg/dL — ABNORMAL HIGH (ref 0–99)
NonHDL: 182.72
Total CHOL/HDL Ratio: 5
Triglycerides: 214 mg/dL — ABNORMAL HIGH (ref 0.0–149.0)
VLDL: 42.8 mg/dL — ABNORMAL HIGH (ref 0.0–40.0)

## 2024-07-04 LAB — TSH: TSH: 1.43 u[IU]/mL (ref 0.35–5.50)

## 2024-07-05 ENCOUNTER — Ambulatory Visit: Payer: Self-pay | Admitting: Internal Medicine

## 2024-07-06 ENCOUNTER — Ambulatory Visit: Admitting: Internal Medicine

## 2024-07-06 ENCOUNTER — Encounter: Payer: Self-pay | Admitting: Internal Medicine

## 2024-07-06 VITALS — BP 118/72 | HR 82 | Temp 98.0°F | Ht 69.0 in | Wt 284.0 lb

## 2024-07-06 DIAGNOSIS — E041 Nontoxic single thyroid nodule: Secondary | ICD-10-CM

## 2024-07-06 DIAGNOSIS — I10 Essential (primary) hypertension: Secondary | ICD-10-CM

## 2024-07-06 DIAGNOSIS — F439 Reaction to severe stress, unspecified: Secondary | ICD-10-CM

## 2024-07-06 DIAGNOSIS — Z0000 Encounter for general adult medical examination without abnormal findings: Secondary | ICD-10-CM

## 2024-07-06 DIAGNOSIS — M4317 Spondylolisthesis, lumbosacral region: Secondary | ICD-10-CM

## 2024-07-06 DIAGNOSIS — Z1211 Encounter for screening for malignant neoplasm of colon: Secondary | ICD-10-CM | POA: Diagnosis not present

## 2024-07-06 DIAGNOSIS — E7800 Pure hypercholesterolemia, unspecified: Secondary | ICD-10-CM | POA: Diagnosis not present

## 2024-07-06 NOTE — Progress Notes (Signed)
 "  Subjective:    Patient ID: Cindy Martin, female    DOB: Dec 21, 1970, 53 y.o.   MRN: [id number]  Patient here for physical exam.   HPI Here for a physical exam. Saw [facility] 05/26/24 - lower extremity swelling. Recommended compression hose. Right leg swelling better. She underwent L5-S1 fusion by Dr. Mavis on 12/02/2023. Follow up 05/23/24. Overall doing better. Feels better. Stays active. No chest pain or sob reported. No abdominal pain or bowel change reported. Has decreased soda intake. Blood pressure doing better - 120s/70-80.    Past Medical History:  Diagnosis Date   Back pain 10/05/2023   Chicken pox    COVID 11/08/2023   Hypercholesteremia 11/09/2023   Hypertension    Menorrhagia    Thyroid  nodule 01/2022   benign   Past Surgical History:  Procedure Laterality Date   COLONOSCOPY WITH PROPOFOL  N/A 10/24/2021   Procedure: COLONOSCOPY WITH PROPOFOL ;  Surgeon: Therisa Bi, MD;  Location: [facility] ENDOSCOPY;  Service: Gastroenterology;  Laterality: N/A;   CYSTOSCOPY N/A 01/30/2020   Procedure: CYSTOSCOPY;  Surgeon: Arloa Lamar SQUIBB, MD;  Location: [facility] ORS;  Service: Gynecology;  Laterality: N/A;   TOTAL LAPAROSCOPIC HYSTERECTOMY WITH SALPINGECTOMY Bilateral 01/30/2020   Procedure: TOTAL LAPAROSCOPIC HYSTERECTOMY WITH RIGHT SALPINGECTOMY;  Surgeon: Arloa Lamar SQUIBB, MD;  Location: [facility] ORS;  Service: Gynecology;  Laterality: Bilateral;   TUBAL LIGATION     Tube reversal     Family History  Problem Relation Age of Onset   Stroke Mother    Hypertension Mother    Arthritis Mother    Hypertension Sister    Asthma Daughter    Breast cancer Paternal Grandmother        62-50   Ovarian cancer Neg Hx    Social History   Socioeconomic History   Marital status: Married    Spouse name: Not on file   Number of children: Not on file   Years of education: Not on file   Highest education level: GED or equivalent  Occupational History   Occupation: Manufacturing Systems Engineer  Tobacco Use    Smoking status: Never   Smokeless tobacco: Never  Vaping Use   Vaping status: Never Used  Substance and Sexual Activity   Alcohol use: No   Drug use: No   Sexual activity: Not Currently    Birth control/protection: Surgical    Comment: Tubal ligation/reversed  Other Topics Concern   Not on file  Social History Narrative   Not on file   Social Drivers of Health   Tobacco Use: Low Risk (07/15/2024)   Patient History    Smoking Tobacco Use: Never    Smokeless Tobacco Use: Never    Passive Exposure: Not on file  Financial Resource Strain: Low Risk  (05/15/2024)   Received from [facility]   Overall Financial Resource Strain (CARDIA)    Difficulty of Paying Living Expenses: Not hard at all  Food Insecurity: No Food Insecurity (05/15/2024)   Received from [facility]   Epic    Within the past 12 months, you worried that your food would run out before you got the money to buy more.: Never true    Within the past 12 months, the food you bought just didn't last and you didn't have money to get more.: Never true  Transportation Needs: No Transportation Needs (05/15/2024)   Received from [facility] - Transportation    In the past 12 months, has lack  of transportation kept you from medical appointments or from getting medications?: No    Lack of Transportation (Non-Medical): No  Physical Activity: Inactive (05/03/2024)   Exercise Vital Sign    Days of Exercise per Week: 0 days    Minutes of Exercise per Session: Not on file  Stress: No Stress Concern Present (05/03/2024)   [employer] - Occupational Stress Questionnaire    Feeling of Stress: Not at all  Social Connections: Socially Integrated (05/03/2024)   Social Connection and Isolation Panel    Frequency of Communication with Friends and Family: More than three times a week    Frequency of Social Gatherings with Friends and Family:  More than three times a week    Attends Religious Services: More than 4 times per year    Active Member of [employer] or Organizations: Yes    Attends Banker Meetings: 1 to 4 times per year    Marital Status: Married  Depression (PHQ2-9): Low Risk (05/22/2024)   Depression (PHQ2-9)    PHQ-2 Score: 0  Alcohol Screen: Not on file  Housing: Low Risk  (05/15/2024)   Received from [facility]   Epic    In the last 12 months, was there a time when you were not able to pay the mortgage or rent on time?: No    In the past 12 months, how many times have you moved where you were living?: 0    At any time in the past 12 months, were you homeless or living in a shelter (including now)?: No  Utilities: Not At Risk (05/15/2024)   Received from [facility]   Epic    In the past 12 months has the electric, gas, oil, or water  company threatened to shut off services in your home?: No  Health Literacy: Not on file     Review of Systems  Constitutional:  Negative for appetite change and unexpected weight change.  HENT:  Negative for congestion, sinus pressure and sore throat.   Eyes:  Negative for pain and visual disturbance.  Respiratory:  Negative for cough, chest tightness and shortness of breath.   Cardiovascular:  Negative for chest pain and palpitations.       Leg swelling better.   Gastrointestinal:  Negative for abdominal pain, diarrhea, nausea and vomiting.  Genitourinary:  Negative for difficulty urinating and dysuria.  Musculoskeletal:  Negative for joint swelling and myalgias.  Skin:  Negative for color change and rash.  Neurological:  Negative for dizziness and headaches.  Hematological:  Negative for adenopathy. Does not bruise/bleed easily.  Psychiatric/Behavioral:  Negative for agitation and dysphoric mood.        Objective:     BP 118/72   Pulse 82   Temp 98 F (36.7 C) (Oral)   Ht 5' 9 (1.753 m)   Wt 284 lb (128.8 kg)   LMP  01/02/2020   SpO2 95%   BMI 41.94 kg/m  Wt Readings from Last 3 Encounters:  07/06/24 284 lb (128.8 kg)  05/26/24 278 lb (126.1 kg)  05/22/24 277 lb 12 oz (126 kg)    Physical Exam Vitals reviewed.  Constitutional:      General: She is not in acute distress.    Appearance: Normal appearance. She is well-developed.  HENT:     Head: Normocephalic and atraumatic.     Right Ear: External ear normal.     Left Ear: External ear normal.  Mouth/Throat:     Pharynx: No oropharyngeal exudate or posterior oropharyngeal erythema.  Eyes:     General: No scleral icterus.       Right eye: No discharge.        Left eye: No discharge.     Conjunctiva/sclera: Conjunctivae normal.  Neck:     Thyroid : No thyromegaly.  Cardiovascular:     Rate and Rhythm: Normal rate and regular rhythm.  Pulmonary:     Effort: No tachypnea, accessory muscle usage or respiratory distress.     Breath sounds: Normal breath sounds. No decreased breath sounds or wheezing.  Chest:  Breasts:    Right: No inverted nipple, mass, nipple discharge or tenderness (no axillary adenopathy).     Left: No inverted nipple, mass, nipple discharge or tenderness (no axilarry adenopathy).  Abdominal:     General: Bowel sounds are normal.     Palpations: Abdomen is soft.     Tenderness: There is no abdominal tenderness.  Musculoskeletal:        General: No swelling or tenderness.     Cervical back: Neck supple.  Lymphadenopathy:     Cervical: No cervical adenopathy.  Skin:    Findings: No erythema or rash.  Neurological:     Mental Status: She is alert and oriented to person, place, and time.  Psychiatric:        Mood and Affect: Mood normal.        Behavior: Behavior normal.         Outpatient Encounter Medications as of 07/06/2024  Medication Sig   cyclobenzaprine  (FLEXERIL ) 10 MG tablet Take 1 tablet (10 mg total) by mouth 2 (two) times daily as needed for muscle spasms.   [DISCONTINUED]  HYDROcodone-acetaminophen  (NORCO/VICODIN) 5-325 MG tablet Take 1 tablet by mouth every 6 (six) hours as needed.   amLODipine  (NORVASC ) 5 MG tablet Take 1 tablet (5 mg total) by mouth daily.   busPIRone  (BUSPAR ) 5 MG tablet Take 1 tablet (5 mg total) by mouth 2 (two) times daily.   carvedilol  (COREG ) 6.25 MG tablet Take 1 tablet (6.25 mg total) by mouth 2 (two) times daily with a meal.   sertraline  (ZOLOFT ) 100 MG tablet Take 1 tablet (100 mg total) by mouth daily.   valsartan -hydrochlorothiazide  (DIOVAN -HCT) 320-12.5 MG tablet Take 1 tablet by mouth daily.   [DISCONTINUED] amLODipine  (NORVASC ) 5 MG tablet Take 1 tablet (5 mg total) by mouth daily.   [DISCONTINUED] busPIRone  (BUSPAR ) 5 MG tablet Take 1 tablet (5 mg total) by mouth 2 (two) times daily.   [DISCONTINUED] carvedilol  (COREG ) 6.25 MG tablet TAKE 1 TABLET BY MOUTH 2 TIMES DAILY WITH A MEAL.   [DISCONTINUED] LYRICA 25 MG capsule Take 25 mg by mouth 2 (two) times daily. (Patient taking differently: Take 25 mg by mouth 2 (two) times daily as needed.)   [DISCONTINUED] oxyCODONE -acetaminophen  (PERCOCET) 5-325 MG tablet Take 1-2 tablets by mouth every 4 (four) hours as needed.   [DISCONTINUED] sertraline  (ZOLOFT ) 100 MG tablet Take 1 tablet (100 mg total) by mouth daily.   [DISCONTINUED] valsartan -hydrochlorothiazide  (DIOVAN -HCT) 320-12.5 MG tablet Take 1 tablet by mouth daily.   No facility-administered encounter medications on file as of 07/06/2024.     Lab Results  Component Value Date   WBC 8.0 02/14/2024   HGB 14.9 02/14/2024   HCT 44.1 02/14/2024   PLT 265.0 02/14/2024   GLUCOSE 108 (H) 07/04/2024   CHOL 234 (H) 07/04/2024   TRIG 214.0 (H) 07/04/2024   HDL 51.70 07/04/2024  LDLCALC 140 (H) 07/04/2024   ALT 53 (H) 07/04/2024   AST 33 07/04/2024   NA 140 07/04/2024   K 3.9 07/04/2024   CL 104 07/04/2024   CREATININE 0.98 07/04/2024   BUN 26 (H) 07/04/2024   CO2 28 07/04/2024   TSH 1.43 07/04/2024   HGBA1C 5.6 12/12/2019     MM 3D SCREENING MAMMOGRAM BILATERAL BREAST Result Date: 04/13/2024 CLINICAL DATA:  Screening. EXAM: DIGITAL SCREENING BILATERAL MAMMOGRAM WITH TOMOSYNTHESIS AND CAD TECHNIQUE: Bilateral screening digital craniocaudal and mediolateral oblique mammograms were obtained. Bilateral screening digital breast tomosynthesis was performed. The images were evaluated with computer-aided detection. COMPARISON:  Previous exam(s). ACR Breast Density Category b: There are scattered areas of fibroglandular density. FINDINGS: There are no findings suspicious for malignancy. IMPRESSION: No mammographic evidence of malignancy. A result letter of this screening mammogram will be mailed directly to the patient. RECOMMENDATION: Screening mammogram in one year. (Code:SM-B-01Y) BI-RADS CATEGORY  1: Negative. Electronically Signed   By: Inocente Ast M.D.   On: 04/13/2024 14:35       Assessment & Plan:  Healthcare maintenance Assessment & Plan: Physical today (07/06/24).  Has been getting pap smears through gyn.  Mammogram 04/12/24 - Birads I. Colonoscopy 10/2021 - recommended f/u in 3 years.     Colon cancer screening Assessment & Plan: Colonoscopy 10/2021 - recommended f/u in 3 years. Due 2026.   Orders: -     Ambulatory referral to Gastroenterology  Hypertension, essential Assessment & Plan: Blood pressure doing better on current regimen - diovan /hydrochlorothiazide  and carvedilol . Blood pressures as outlined. Doing better. Continue current medication regimen. Follow pressures. Follow metabolic panel.   Orders: -     Basic metabolic panel with GFR; Future -     CBC with Differential/Platelet; Future  Hypercholesteremia Assessment & Plan: Low cholesterol diet and exercise. Follow lipid panel.   Orders: -     Lipid panel; Future -     Hepatic function panel; Future  Thyroid  nodule Assessment & Plan:  S/p thyroid  biopsy - benign. Saw endocrinology 05/15/24 - f/u thyroid  nodules. Recommended f/u neck  ultrasound and tsh.    Stress Assessment & Plan: Continues on zoloft . Stress is better. Overall doing better. Follow.    Spondylolisthesis of lumbosacral region Assessment & Plan: S/p L5-S1 decompression/fusion - Emerge. Overall feels stable. Continue f/u with ortho.    Other orders -     amLODIPine  Besylate; Take 1 tablet (5 mg total) by mouth daily.  Dispense: 90 tablet; Refill: 3 -     busPIRone  HCl; Take 1 tablet (5 mg total) by mouth 2 (two) times daily.  Dispense: 180 tablet; Refill: 1 -     Carvedilol ; Take 1 tablet (6.25 mg total) by mouth 2 (two) times daily with a meal.  Dispense: 180 tablet; Refill: 3 -     Sertraline  HCl; Take 1 tablet (100 mg total) by mouth daily.  Dispense: 90 tablet; Refill: 1 -     Valsartan -hydroCHLOROthiazide ; Take 1 tablet by mouth daily.  Dispense: 90 tablet; Refill: 1     Allena Hamilton, MD "

## 2024-07-06 NOTE — Assessment & Plan Note (Signed)
 Physical today (07/06/24).  Has been getting pap smears through gyn.  Mammogram 04/12/24 - Birads I. Colonoscopy 10/2021 - recommended f/u in 3 years.

## 2024-07-15 ENCOUNTER — Encounter: Payer: Self-pay | Admitting: Internal Medicine

## 2024-07-15 MED ORDER — SERTRALINE HCL 100 MG PO TABS: 100.0000 mg | ORAL_TABLET | Freq: Every day | ORAL | 1 refills | Status: AC

## 2024-07-15 MED ORDER — AMLODIPINE BESYLATE 5 MG PO TABS: 5.0000 mg | ORAL_TABLET | Freq: Every day | ORAL | 3 refills | Status: AC

## 2024-07-15 MED ORDER — CARVEDILOL 6.25 MG PO TABS: 6.2500 mg | ORAL_TABLET | Freq: Two times a day (BID) | ORAL | 3 refills | Status: AC

## 2024-07-15 MED ORDER — VALSARTAN-HYDROCHLOROTHIAZIDE 320-12.5 MG PO TABS: 1.0000 | ORAL_TABLET | Freq: Every day | ORAL | 1 refills | Status: AC

## 2024-07-15 MED ORDER — BUSPIRONE HCL 5 MG PO TABS: 5.0000 mg | ORAL_TABLET | Freq: Two times a day (BID) | ORAL | 1 refills | Status: AC

## 2024-07-15 NOTE — Assessment & Plan Note (Signed)
 Colonoscopy 10/2021 - recommended f/u in 3 years. Due 2026.

## 2024-07-15 NOTE — Assessment & Plan Note (Signed)
 S/p thyroid  biopsy - benign. Saw endocrinology 05/15/24 - f/u thyroid  nodules. Recommended f/u neck ultrasound and tsh.

## 2024-07-15 NOTE — Assessment & Plan Note (Signed)
 S/p L5-S1 decompression/fusion - Emerge. Overall feels stable. Continue f/u with ortho.

## 2024-07-15 NOTE — Assessment & Plan Note (Signed)
 Continues on zoloft . Stress is better. Overall doing better. Follow.

## 2024-07-15 NOTE — Assessment & Plan Note (Signed)
 Blood pressure doing better on current regimen - diovan /hydrochlorothiazide  and carvedilol . Blood pressures as outlined. Doing better. Continue current medication regimen. Follow pressures. Follow metabolic panel.

## 2024-07-15 NOTE — Assessment & Plan Note (Signed)
 Low cholesterol diet and exercise.  Follow lipid panel.

## 2024-10-30 ENCOUNTER — Ambulatory Visit: Admit: 2024-10-30 | Admitting: Gastroenterology

## 2024-10-30 SURGERY — COLONOSCOPY
Anesthesia: General
